# Patient Record
Sex: Male | Born: 1965 | Race: White | Hispanic: No | Marital: Single | Smoking: Current every day smoker
Health system: Southern US, Community
[De-identification: ages and names within clinical notes are randomized; demographics above are authoritative.]

## PROBLEM LIST (undated history)

## (undated) DIAGNOSIS — I7300 Raynaud's syndrome without gangrene: Secondary | ICD-10-CM

## (undated) DIAGNOSIS — Z0289 Encounter for other administrative examinations: Secondary | ICD-10-CM

## (undated) DIAGNOSIS — M1990 Unspecified osteoarthritis, unspecified site: Secondary | ICD-10-CM

## (undated) DIAGNOSIS — M329 Systemic lupus erythematosus, unspecified: Secondary | ICD-10-CM

## (undated) DIAGNOSIS — Z8739 Personal history of other diseases of the musculoskeletal system and connective tissue: Secondary | ICD-10-CM

## (undated) DIAGNOSIS — J449 Chronic obstructive pulmonary disease, unspecified: Secondary | ICD-10-CM

## (undated) DIAGNOSIS — M797 Fibromyalgia: Secondary | ICD-10-CM

## (undated) HISTORY — DX: Fibromyalgia: M79.7

## (undated) HISTORY — PX: HERNIA REPAIR: SHX51

## (undated) HISTORY — PX: ABDOMINAL SURGERY: SHX537

## (undated) HISTORY — DX: Raynaud's syndrome without gangrene: I73.00

## (undated) HISTORY — DX: Unspecified osteoarthritis, unspecified site: M19.90

## (undated) HISTORY — DX: Encounter for other administrative examinations: Z02.89

## (undated) HISTORY — DX: Chronic obstructive pulmonary disease, unspecified: J44.9

## (undated) HISTORY — DX: Systemic lupus erythematosus, unspecified: M32.9

---

## 2002-07-11 ENCOUNTER — Inpatient Hospital Stay (HOSPITAL_COMMUNITY): Admission: EM | Admit: 2002-07-11 | Discharge: 2002-07-14 | Payer: Self-pay | Admitting: Psychiatry

## 2006-01-15 ENCOUNTER — Emergency Department: Payer: Self-pay | Admitting: Emergency Medicine

## 2006-01-19 ENCOUNTER — Ambulatory Visit: Payer: Self-pay | Admitting: Psychiatry

## 2006-01-19 ENCOUNTER — Inpatient Hospital Stay (HOSPITAL_COMMUNITY): Admission: AD | Admit: 2006-01-19 | Discharge: 2006-01-23 | Payer: Self-pay | Admitting: Psychiatry

## 2006-04-25 ENCOUNTER — Emergency Department: Payer: Self-pay | Admitting: General Practice

## 2006-05-11 ENCOUNTER — Inpatient Hospital Stay: Payer: Self-pay | Admitting: Unknown Physician Specialty

## 2007-10-02 ENCOUNTER — Emergency Department: Payer: Self-pay | Admitting: Emergency Medicine

## 2007-10-02 ENCOUNTER — Other Ambulatory Visit: Payer: Self-pay

## 2010-08-06 ENCOUNTER — Inpatient Hospital Stay: Payer: Self-pay | Admitting: Psychiatry

## 2010-08-21 ENCOUNTER — Inpatient Hospital Stay: Payer: Self-pay | Admitting: Psychiatry

## 2010-12-09 ENCOUNTER — Emergency Department: Payer: Self-pay | Admitting: Emergency Medicine

## 2010-12-18 ENCOUNTER — Emergency Department: Payer: Self-pay | Admitting: Emergency Medicine

## 2014-07-23 ENCOUNTER — Emergency Department
Admit: 2014-07-23 | Disposition: A | Discharge status: Home / Self Care | Payer: Self-pay | Admitting: Emergency Medicine

## 2014-12-05 ENCOUNTER — Emergency Department
Admission: EM | Admit: 2014-12-05 | Discharge: 2014-12-05 | Disposition: A | Discharge status: Home / Self Care | Payer: Self-pay | Attending: Emergency Medicine | Admitting: Emergency Medicine

## 2014-12-05 ENCOUNTER — Encounter: Payer: Self-pay | Admitting: *Deleted

## 2014-12-05 DIAGNOSIS — Y9389 Activity, other specified: Secondary | ICD-10-CM | POA: Insufficient documentation

## 2014-12-05 DIAGNOSIS — X58XXXA Exposure to other specified factors, initial encounter: Secondary | ICD-10-CM | POA: Insufficient documentation

## 2014-12-05 DIAGNOSIS — T1502XA Foreign body in cornea, left eye, initial encounter: Secondary | ICD-10-CM | POA: Insufficient documentation

## 2014-12-05 DIAGNOSIS — Z720 Tobacco use: Secondary | ICD-10-CM | POA: Insufficient documentation

## 2014-12-05 DIAGNOSIS — Y998 Other external cause status: Secondary | ICD-10-CM | POA: Insufficient documentation

## 2014-12-05 DIAGNOSIS — T1592XA Foreign body on external eye, part unspecified, left eye, initial encounter: Secondary | ICD-10-CM

## 2014-12-05 DIAGNOSIS — Y9289 Other specified places as the place of occurrence of the external cause: Secondary | ICD-10-CM | POA: Insufficient documentation

## 2014-12-05 HISTORY — DX: Unspecified osteoarthritis, unspecified site: M19.90

## 2014-12-05 MED ORDER — CIPROFLOXACIN HCL 0.3 % OP SOLN
OPHTHALMIC | Status: AC
  Filled 2014-12-05: qty 2.5

## 2014-12-05 MED ORDER — CIPROFLOXACIN HCL 0.3 % OP SOLN
2.0000 [drp] | OPHTHALMIC | Status: DC
  Administered 2014-12-05: 2 [drp] via OPHTHALMIC

## 2014-12-05 MED ORDER — FLUORESCEIN SODIUM 1 MG OP STRP: 1.0000 | ORAL_STRIP | Freq: Once | OPHTHALMIC | Status: DC

## 2014-12-05 MED ORDER — CIPROFLOXACIN HCL 0.3 % OP SOLN: 1.0000 [drp] | OPHTHALMIC | Status: AC

## 2014-12-05 MED ORDER — TETRACAINE HCL 0.5 % OP SOLN
2.0000 [drp] | Freq: Once | OPHTHALMIC | Status: AC
  Administered 2014-12-05: 2 [drp] via OPHTHALMIC

## 2014-12-05 MED ORDER — KETOROLAC TROMETHAMINE 0.5 % OP SOLN: 1.0000 [drp] | Freq: Four times a day (QID) | OPHTHALMIC | Status: DC

## 2014-12-05 MED ORDER — TETRACAINE HCL 0.5 % OP SOLN
OPHTHALMIC | Status: AC
  Administered 2014-12-05: 2 [drp] via OPHTHALMIC
  Filled 2014-12-05: qty 2

## 2014-12-05 MED ORDER — FLUORESCEIN SODIUM 1 MG OP STRP
ORAL_STRIP | OPHTHALMIC | Status: AC
  Filled 2014-12-05: qty 1

## 2014-12-05 NOTE — ED Provider Notes (Signed)
[facility] Emergency Department Provider Note ____________________________________________  Time seen: Approximately 11:26 AM  I have reviewed the triage vital signs and the nursing notes.   HISTORY  Chief Complaint Foreign Body in Eye   HPI Jason Cantu is a 49 y.o. male who presents to the emergency department for left eye pain. He states that on Thursday he was helping drill holes in a brick wall that had metal behind it. He states he felt something hit his eye. He states he is resistant several times and thought that he had gotten it out, but overnight the pain and irritation has gotten worse.   Past Medical History  Diagnosis Date  . Arthritis     There are no active problems to display for this patient.   No past surgical history on file.  Current Outpatient Rx  Name  Route  Sig  Dispense  Refill  . ciprofloxacin (CILOXAN) 0.3 % ophthalmic solution   Right Eye   Place 1 drop into the right eye every 4 (four) hours while awake. Administer 1 drop, every 2 hours, while awake, for 2 days. Then 1 drop, every 4 hours, while awake, for the next 5 days.   5 mL   0   . ketorolac (ACULAR) 0.5 % ophthalmic solution   Left Eye   Place 1 drop into the left eye 4 (four) times daily.   5 mL   0     Allergies Codeine and Nortriptyline  No family history on file.  Social History Social History  Substance Use Topics  . Smoking status: Current Every Day Smoker -- 1.00 packs/day    Types: Cigarettes  . Smokeless tobacco: None  . Alcohol Use: Yes    Review of Systems   Constitutional: No fever/chills Eyes: Visual changes: yes--blurry. ENT: No sore throat. Cardiovascular: Denies chest pain. Respiratory: Denies shortness of breath. Gastrointestinal: No abdominal pain.  No nausea, no vomiting.  No diarrhea.  No constipation. Musculoskeletal: Negative for pain. Skin: Negative for rash. Neurological: Negative for headaches, focal weakness  or numbness. Psychiatric:At baseline, no complaint Lymphatic:Swollen nodes-- no Allergic: Seasonal allergies: no 10-point ROS otherwise negative.  ____________________________________________  PHYSICAL EXAM:  VITAL SIGNS: ED Triage Vitals  Enc Vitals Group     BP 12/05/14 1032 117/73 mmHg     Pulse Rate 12/05/14 1032 105     Resp --      Temp 12/05/14 1032 97.8 F (36.6 C)     Temp Source 12/05/14 1032 Oral     SpO2 12/05/14 1032 96 %     Weight 12/05/14 1032 152 lb (68.947 kg)     Height 12/05/14 1032 5\' 6"  (1.676 m)     Head Cir --      Peak Flow --      Pain Score 12/05/14 1033 8     Pain Loc --      Pain Edu? --      Excl. in [place]? --     Constitutional: Alert and oriented. Well appearing and in no acute distress. Eyes: Visual acuity--see nursing documentation; No globe trauma; Eyelids normal to inspection; Everted for exam yes; Conjunctiva and sclera: erythematous; Corneas: foreign body; Examined with fluorescein no; EOM's intact; Pupils PERRLA; Anterior Chambers normal with limited exam.  Head: Atraumatic. Nose: No congestion/rhinnorhea. Mouth/Throat: Mucous membranes are moist.  Oropharynx non-erythematous. Neck: No stridor.  Cardiovascular: Normal rate, regular rhythm. Grossly normal heart sounds.  Good peripheral circulation. Respiratory: Normal respiratory effort.  No  retractions. Gastrointestinal: Soft and nontender. No distention. No abdominal bruits. No CVA tenderness. Musculoskeletal:Normal ROM Neurologic:  Normal speech and language. No gross focal neurologic deficits are appreciated. Speech is normal. No gait instability. Skin:  Skin is warm, dry and intact. No rash noted. Psychiatric: Mood and affect are normal. Speech and behavior are normal.  ____________________________________________   LABS (all labs ordered are listed, but only abnormal results are displayed)  Labs Reviewed - No data to  display ____________________________________________  EKG  ____________________________________________  RADIOLOGY  ____________________________________________   PROCEDURES  Procedure(s) performed:  Foreign body removed from the 8 o'clock position with cotton swab. Black speck. No metal ring noted.   ____________________________________________   INITIAL IMPRESSION / ASSESSMENT AND PLAN / ED COURSE  Pertinent labs & imaging results that were available during my care of the patient were reviewed by me and considered in my medical decision making (see chart for details).  Patient was advised to follow up with ophthalmology for symptoms that are not improving over the next 2 days. He was  also advised to return to the ER for symptoms that change or worsen if unable to schedule an appointment.  ____________________________________________   FINAL CLINICAL IMPRESSION(S) / ED DIAGNOSES  Final diagnoses:  Eye foreign body, left, initial encounter       Victorino Dike, FNP 12/05/14 [place], MD 12/05/14 [phone]

## 2014-12-05 NOTE — ED Notes (Addendum)
Pt states that he was helping a friend drill holes in the wall on Thursday and has either metal or brick in his left eye. He reports that pain, drainage, and swelling worse today.  Pt reports pain 8/10. Eye is red and swollen.

## 2014-12-05 NOTE — ED Notes (Signed)
Pt given eye drops and rx

## 2014-12-05 NOTE — ED Notes (Signed)
Pt has foreign body in left eye, left eye is swollen and red

## 2014-12-05 NOTE — Discharge Instructions (Signed)
Eye, Foreign Body The term foreign body refers to any object near, on the surface of or in the eye that should not be there. A foreign body may be a small speck of dirt or dust, a hair or eyelash, a splinter or any object. CAUSES  Foreign bodies can get in the eye by:  Flying pieces of something that was broken or destroyed (debris).  A sudden injury (trauma) to the eye. SYMPTOMS  Symptoms depend on what the foreign body is and where it is in the eye. The most common locations are:  On the inner surface of the upper or lower eyelids or on the covering of the white part of the eye (conjunctiva). Symptoms in this location are:  Irritating and painful, especially when blinking.  Feeling like something is in the eye.  On the surface of the clear covering on the front of the eye (cornea). A corneal foreign body has symptoms that:  Are painful and irritating since the cornea is very sensitive.  Form small "rust rings" around a metallic foreign body. Metallic foreign bodies stick more firmly to the surface of the cornea.  Inside the eyeball. Infection can happen fast and can be hard to treat with antibiotics. This is an extremely dangerous situation. Foreign bodies inside the eye can threaten vision. A person may even loose their eye. Foreign bodies inside the eye may cause:  Great pain.  Immediate loss of vision. DIAGNOSIS  Foreign bodies are found during an exam by an eye specialist. Those that are on the eyelids, conjunctiva or cornea are usually (but not always) easily found. When a foreign body is inside the eyeball, a cataract may form almost right away. This makes it hard for an ophthalmologist to find the foreign body. Special tests may be needed, including ultrasound testing, X-rays and CT scans. TREATMENT   Foreign bodies that are on the eyelids, conjunctiva or cornea are often removed easily and painlessly.  If the foreign body has caused a scratch or abrasion of the cornea,  antibiotic drops, ointments and/or a tight patch called a "pressure patch" may be needed. Follow-up exams will be needed for several days until the abrasion heals.  Surgery is needed right away if the foreign body is inside the eyeball. This is a medical emergency. An antibiotic therapy will likely be given to stop an infection. HOME CARE INSTRUCTIONS  The use of eye patches is not universal. Their use varies from state to state and from caregiver to caregiver. If an eye patch was applied:  Keep the eye patch on for as long as directed by your caregiver until the follow-up appointment.  Do not remove the patch to put in medications unless instructed to do so. When replacing the patch, retape it as it was before. Follow the same procedure if the patch becomes loose.  WARNING: Do not drive or operate machinery while the eye is patched. The ability to judge distances will be impaired.  Only take over-the-counter or prescription medicines for pain, discomfort or fever as directed by the caregiver. If no eye patch was applied:  Keep the eye closed as much as possible. Do not rub the eye.  Wear dark glasses as needed to protect the eyes from bright light.  Do not wear contact lenses until the eye feels normal again, or as instructed.  Wear protective eye covering if there is a risk of eye injury. This is important when working with high speed tools.  Only take over-the-counter or   prescription medicines for pain, discomfort or fever as directed by the caregiver. SEEK IMMEDIATE MEDICAL CARE IF:   Pain increases in the eye or the vision changes.  You or your child has problems with the eye patch.  The injury to the eye appears to be getting larger.  There is discharge from the injured eye.  Swelling and/or soreness (inflammation) develops around the affected eye.  You or your child has an oral temperature above 102 F (38.9 C), not controlled by medicine.  Your baby is older than 3  months with a rectal temperature of 102 F (38.9 C) or higher.  Your baby is 3 months old or younger with a rectal temperature of 100.4 F (38 C) or higher. MAKE SURE YOU:   Understand these instructions.  Will watch your condition.  Will get help right away if you are not doing well or get worse. Document Released: 03/23/2005 Document Revised: 06/15/2011 Document Reviewed: 08/18/2012 [employer] Patient Information 2015 [employer]. This information is not intended to replace advice given to you by your health care provider. Make sure you discuss any questions you have with your health care provider.  

## 2015-08-22 DIAGNOSIS — I7300 Raynaud's syndrome without gangrene: Secondary | ICD-10-CM

## 2015-08-22 DIAGNOSIS — J449 Chronic obstructive pulmonary disease, unspecified: Secondary | ICD-10-CM

## 2015-08-22 DIAGNOSIS — M329 Systemic lupus erythematosus, unspecified: Secondary | ICD-10-CM

## 2015-08-22 DIAGNOSIS — M1990 Unspecified osteoarthritis, unspecified site: Secondary | ICD-10-CM

## 2015-08-22 DIAGNOSIS — M797 Fibromyalgia: Secondary | ICD-10-CM | POA: Insufficient documentation

## 2015-08-22 HISTORY — DX: Unspecified osteoarthritis, unspecified site: M19.90

## 2015-08-22 HISTORY — DX: Systemic lupus erythematosus, unspecified: M32.9

## 2015-08-22 HISTORY — DX: Chronic obstructive pulmonary disease, unspecified: J44.9

## 2015-08-22 HISTORY — DX: Raynaud's syndrome without gangrene: I73.00

## 2015-08-22 HISTORY — DX: Fibromyalgia: M79.7

## 2015-09-05 DIAGNOSIS — Z0289 Encounter for other administrative examinations: Secondary | ICD-10-CM

## 2015-09-05 HISTORY — DX: Encounter for other administrative examinations: Z02.89

## 2016-01-27 ENCOUNTER — Encounter: Payer: Self-pay | Admitting: Emergency Medicine

## 2016-01-27 ENCOUNTER — Emergency Department: Payer: Medicare HMO

## 2016-01-27 ENCOUNTER — Emergency Department
Admission: EM | Admit: 2016-01-27 | Discharge: 2016-01-27 | Disposition: A | Discharge status: Home / Self Care | Payer: Medicare HMO | Attending: Emergency Medicine | Admitting: Emergency Medicine

## 2016-01-27 DIAGNOSIS — Z79899 Other long term (current) drug therapy: Secondary | ICD-10-CM | POA: Diagnosis not present

## 2016-01-27 DIAGNOSIS — F17210 Nicotine dependence, cigarettes, uncomplicated: Secondary | ICD-10-CM | POA: Insufficient documentation

## 2016-01-27 DIAGNOSIS — K5732 Diverticulitis of large intestine without perforation or abscess without bleeding: Secondary | ICD-10-CM | POA: Insufficient documentation

## 2016-01-27 DIAGNOSIS — Z7982 Long term (current) use of aspirin: Secondary | ICD-10-CM | POA: Diagnosis not present

## 2016-01-27 DIAGNOSIS — R112 Nausea with vomiting, unspecified: Secondary | ICD-10-CM

## 2016-01-27 DIAGNOSIS — K2970 Gastritis, unspecified, without bleeding: Secondary | ICD-10-CM | POA: Diagnosis not present

## 2016-01-27 DIAGNOSIS — Z791 Long term (current) use of non-steroidal anti-inflammatories (NSAID): Secondary | ICD-10-CM | POA: Insufficient documentation

## 2016-01-27 DIAGNOSIS — R197 Diarrhea, unspecified: Secondary | ICD-10-CM

## 2016-01-27 HISTORY — DX: Systemic lupus erythematosus, unspecified: M32.9

## 2016-01-27 LAB — COMPREHENSIVE METABOLIC PANEL
ALBUMIN: 4.6 g/dL (ref 3.5–5.0)
ALT: 19 U/L (ref 17–63)
ANION GAP: 11 (ref 5–15)
AST: 26 U/L (ref 15–41)
Alkaline Phosphatase: 54 U/L (ref 38–126)
BILIRUBIN TOTAL: 1.2 mg/dL (ref 0.3–1.2)
BUN: 12 mg/dL (ref 6–20)
CHLORIDE: 99 mmol/L — AB (ref 101–111)
CO2: 23 mmol/L (ref 22–32)
Calcium: 10.4 mg/dL — ABNORMAL HIGH (ref 8.9–10.3)
Creatinine, Ser: 0.92 mg/dL (ref 0.61–1.24)
GFR calc Af Amer: >60 mL/min (ref >60)
GFR calc non Af Amer: >60 mL/min (ref >60)
GLUCOSE: 122 mg/dL — AB (ref 65–99)
POTASSIUM: 3.3 mmol/L — AB (ref 3.5–5.1)
SODIUM: 133 mmol/L — AB (ref 135–145)
TOTAL PROTEIN: 8.6 g/dL — AB (ref 6.5–8.1)

## 2016-01-27 LAB — URINALYSIS COMPLETE WITH MICROSCOPIC (ARMC ONLY)
BACTERIA UA: NONE SEEN
Bilirubin Urine: NEGATIVE
Glucose, UA: NEGATIVE mg/dL
Hgb urine dipstick: NEGATIVE
NITRITE: NEGATIVE
PH: 5 (ref 5.0–8.0)
PROTEIN: 100 mg/dL — AB
SPECIFIC GRAVITY, URINE: 1.021 (ref 1.005–1.030)
SQUAMOUS EPITHELIAL / LPF: NONE SEEN

## 2016-01-27 LAB — CBC
HEMATOCRIT: 50.2 % (ref 40.0–52.0)
HEMOGLOBIN: 17.4 g/dL (ref 13.0–18.0)
MCH: 34.6 pg — ABNORMAL HIGH (ref 26.0–34.0)
MCHC: 34.6 g/dL (ref 32.0–36.0)
MCV: 99.8 fL (ref 80.0–100.0)
Platelets: 272 10*3/uL (ref 150–440)
RBC: 5.03 MIL/uL (ref 4.40–5.90)
RDW: 13.1 % (ref 11.5–14.5)
WBC: 15.2 10*3/uL — ABNORMAL HIGH (ref 3.8–10.6)

## 2016-01-27 LAB — TROPONIN I

## 2016-01-27 LAB — LIPASE, BLOOD: LIPASE: 29 U/L (ref 11–51)

## 2016-01-27 MED ORDER — OXYCODONE-ACETAMINOPHEN 5-325 MG PO TABS: 1.0000 | ORAL_TABLET | Freq: Four times a day (QID) | ORAL | 0 refills | Status: DC | PRN

## 2016-01-27 MED ORDER — ONDANSETRON HCL 4 MG/2ML IJ SOLN
4.0000 mg | Freq: Once | INTRAMUSCULAR | Status: AC
  Administered 2016-01-27: 4 mg via INTRAVENOUS
  Filled 2016-01-27: qty 2

## 2016-01-27 MED ORDER — IOPAMIDOL (ISOVUE-300) INJECTION 61%
100.0000 mL | Freq: Once | INTRAVENOUS | Status: AC | PRN
  Administered 2016-01-27: 100 mL via INTRAVENOUS

## 2016-01-27 MED ORDER — FENTANYL CITRATE (PF) 100 MCG/2ML IJ SOLN
50.0000 ug | INTRAMUSCULAR | Status: AC | PRN
  Administered 2016-01-27 (×2): 50 ug via INTRAVENOUS
  Filled 2016-01-27 (×2): qty 2

## 2016-01-27 MED ORDER — GI COCKTAIL ~~LOC~~
30.0000 mL | Freq: Once | ORAL | Status: AC
  Administered 2016-01-27: 30 mL via ORAL
  Filled 2016-01-27: qty 30

## 2016-01-27 MED ORDER — CIPROFLOXACIN HCL 500 MG PO TABS: 500.0000 mg | ORAL_TABLET | Freq: Two times a day (BID) | ORAL | 0 refills | Status: AC

## 2016-01-27 MED ORDER — SODIUM CHLORIDE 0.9 % IV BOLUS (SEPSIS)
1000.0000 mL | Freq: Once | INTRAVENOUS | Status: AC
  Administered 2016-01-27: 1000 mL via INTRAVENOUS

## 2016-01-27 MED ORDER — METRONIDAZOLE 500 MG PO TABS: 500.0000 mg | ORAL_TABLET | Freq: Three times a day (TID) | ORAL | 0 refills | Status: AC

## 2016-01-27 MED ORDER — PANTOPRAZOLE SODIUM 20 MG PO TBEC: 20.0000 mg | DELAYED_RELEASE_TABLET | Freq: Every day | ORAL | 1 refills | Status: DC

## 2016-01-27 MED ORDER — ONDANSETRON 4 MG PO TBDP: 4.0000 mg | ORAL_TABLET | Freq: Three times a day (TID) | ORAL | 0 refills | Status: DC | PRN

## 2016-01-27 MED ORDER — IOPAMIDOL (ISOVUE-300) INJECTION 61%
30.0000 mL | Freq: Once | INTRAVENOUS | Status: AC | PRN
  Administered 2016-01-27: 30 mL via ORAL

## 2016-01-27 NOTE — ED Triage Notes (Signed)
Upper abd pain graduall onset over last few days.  Also chest pain.   Diarrhea and vomiting.

## 2016-01-27 NOTE — ED Notes (Signed)
Report from Janie, RN. Care assumed by this RN. 

## 2016-01-27 NOTE — ED Provider Notes (Signed)
[facility] Emergency Department Provider Note  Time seen: 9:16 AM  I have reviewed the triage vital signs and the nursing notes.   HISTORY  Chief Complaint Abdominal Pain and Emesis    HPI Jason Cantu is a 50 y.o. male with a past medical history of lupus, who presents to the emergency department with upper abdominal pain, nausea, vomiting, diarrhea. According to the patient for the past 2 weeks he has been experiencing intermittent upper abdominal pain which she describes as a burning aching sensation. States the pain has been constant over the past 4 days. States nausea, vomiting, occasional episodes of diarrhea. States he will often have to wake up first thing in the morning to vomit and then feels somewhat better. Patient states he had similar issues 10-15 years ago when he had an endoscopy performed. He was placed on Nexium and had resolution of his discomfort. He has been taking Prilosec daily without any relief this time. Denies any fever. Describes his pain as moderate currently. States he has not been eating or drinking much over the past 3 or 4 days. Patient does state he normally drinks 3 or 4 beers per day.  Past Medical History:  Diagnosis Date  . Arthritis   . Lupus (systemic lupus erythematosus) (HCC)     There are no active problems to display for this patient.   Past Surgical History:  Procedure Laterality Date  . ABDOMINAL SURGERY    . HERNIA REPAIR      Prior to Admission medications   Medication Sig Start Date End Date Taking? Authorizing Provider  ketorolac (ACULAR) 0.5 % ophthalmic solution Place 1 drop into the left eye 4 (four) times daily. 12/05/14   Victorino Dike, FNP    Allergies  Allergen Reactions  . Codeine Nausea Only  . Nortriptyline Other (See Comments)    Other reaction(s): Other (See Comments) Caused body to jerk all over per pt.  . Hydrocodone Nausea And Vomiting    No family history on file.  Social  History Social History  Substance Use Topics  . Smoking status: Current Every Day Smoker    Packs/day: 1.00    Types: Cigarettes  . Smokeless tobacco: Never Used  . Alcohol use Yes    Review of Systems Constitutional: Negative for fever. Cardiovascular: Negative for chest pain. Respiratory: Negative for shortness of breath. Gastrointestinal: Upper abdominal pain. Positive nausea, vomiting, diarrhea. Genitourinary: Negative for dysuria. Neurological: Negative for headache 10-point ROS otherwise negative.  ____________________________________________   PHYSICAL EXAM:  VITAL SIGNS: ED Triage Vitals [01/27/16 0732]  Enc Vitals Group     BP (!) 155/90     Pulse Rate 81     Resp 18     Temp 98.2 F (36.8 C)     Temp Source Oral     SpO2 98 %     Weight      Height      Head Circumference      Peak Flow      Pain Score 10     Pain Loc      Pain Edu?      Excl. in [place]?     Constitutional: Alert and oriented. Well appearing and in no distress. Eyes: Normal exam ENT   Head: Normocephalic and atraumatic.   Mouth/Throat: Mucous membranes are moist. Cardiovascular: Normal rate, regular rhythm. No murmur Respiratory: Normal respiratory effort without tachypnea nor retractions. Breath sounds are clear  Gastrointestinal: Soft, diffuse moderate tenderness palpation  without rebound or guarding, appears to have more pain in the upper abdomen. No distention. Musculoskeletal: Nontender with normal range of motion in all extremities.  Neurologic:  Normal speech and language. No gross focal neurologic deficits Skin:  Skin is warm, dry and intact.  Psychiatric: Mood and affect are normal. Speech and behavior are normal.   ____________________________________________    EKG  EKG reviewed and interpreted by myself shows sinus tachycardia 103 bpm, narrow QRS, left axis deviation, normal intervals with nonspecific ST changes. No ST  elevation.  ____________________________________________    RADIOLOGY  CT shows possible diverticulitis acute first chronic.  ____________________________________________   INITIAL IMPRESSION / ASSESSMENT AND PLAN / ED COURSE  Pertinent labs & imaging results that were available during my care of the patient were reviewed by me and considered in my medical decision making (see chart for details).  Patient presents for diffuse abdominal pain worse over the past 4 days. States the pain is more upper body hurts all over his abdomen. Patient's symptoms are very suggestive of gastritis/ulcerative disease. However given the patient's diffuse moderate tenderness to palpation on physical exam along with leukocytosis, will proceed with a CT abdomen/pelvis to further evaluate. We will treat with a GI cocktail, IV fluids and treat the patient's nausea.  CT shows possible diverticulitis acute first chronic. Given the patient's increased abdominal pain with diarrhea we'll cover with antibiotics. We'll also place the patient on Protonix and Zofran. Patient will need to follow up with GI medicine. I discussed this plan of care with the patient, he is agreeable. ____________________________________________   FINAL CLINICAL IMPRESSION(S) / ED DIAGNOSES  Gastritis Epigastric pain Diverticulitis   Harvest Dark, MD 01/27/16 1142

## 2016-01-27 NOTE — Discharge Instructions (Signed)
As we discussed please take your antibiotics as prescribed. Nausea medication as needed. Please follow-up with GI medicine by calling the number provided to arrange a follow-up appointment as soon as possible. Return to the emergency department for any worsening symptoms.

## 2016-01-27 NOTE — ED Notes (Signed)
MD at bedside. 

## 2016-01-27 NOTE — ED Notes (Signed)
Pt alert and oriented X4, active, cooperative, pt in NAD. RR even and unlabored, color WNL.  Pt informed to return if any life threatening symptoms occur.   

## 2016-01-28 ENCOUNTER — Other Ambulatory Visit: Payer: Self-pay

## 2016-01-29 ENCOUNTER — Ambulatory Visit (INDEPENDENT_AMBULATORY_CARE_PROVIDER_SITE_OTHER): Payer: Self-pay | Admitting: Gastroenterology

## 2016-01-29 ENCOUNTER — Other Ambulatory Visit: Payer: Self-pay

## 2016-01-29 ENCOUNTER — Encounter: Payer: Self-pay | Admitting: Gastroenterology

## 2016-01-29 VITALS — BP 102/71 | HR 85 | Temp 98.2°F | Ht 66.0 in | Wt 141.0 lb

## 2016-01-29 DIAGNOSIS — K5792 Diverticulitis of intestine, part unspecified, without perforation or abscess without bleeding: Secondary | ICD-10-CM

## 2016-01-29 DIAGNOSIS — R112 Nausea with vomiting, unspecified: Secondary | ICD-10-CM | POA: Insufficient documentation

## 2016-01-29 DIAGNOSIS — R197 Diarrhea, unspecified: Secondary | ICD-10-CM | POA: Insufficient documentation

## 2016-01-29 DIAGNOSIS — R1084 Generalized abdominal pain: Secondary | ICD-10-CM

## 2016-01-29 DIAGNOSIS — F1220 Cannabis dependence, uncomplicated: Secondary | ICD-10-CM

## 2016-01-29 DIAGNOSIS — Z791 Long term (current) use of non-steroidal anti-inflammatories (NSAID): Secondary | ICD-10-CM | POA: Insufficient documentation

## 2016-01-29 MED ORDER — PEG 3350-KCL-NABCB-NACL-NASULF 236 G PO SOLR: ORAL | 0 refills | Status: DC

## 2016-01-29 NOTE — Progress Notes (Signed)
Gastroenterology Consultation  Referring Provider:     No ref. provider found Primary Care Physician:  Pcp Not In System Primary Gastroenterologist:  Dr. [name]  Reason for Consultation:     Acute diverticulitis        HPI:   Jason Cantu is a 50 y.o. y/o male referred for consultation & management  by [facility] ER for acute diverticulitis He was recently seen at the ER at [facility] on 01/27/16 for upper abdominal pain , nausea, vomiting and diarrhea for 2 weeks.He underwent a CT scan of the abdomen which demonstrated possible either acute or chronic diverticulitis- tumor cannot be excluded . He did have an elevated WCC of 15 , Hb 17 grams. He was commenced on Zofran, oral antibiotics(ciprofloxacin and metronidazole till 02/10/16 ) and PPI , discharged with plan to follow up with GI.   Abdominal pain: Onset began earlier in the month on and off and the last 7 days all day long  Site all over the abdomen Radiation localized  Severity : this morning was 10/10 , 5-6/10 presently , comes and goes  Petra Kuba of pain: twisting in nature  Aggravating factors: eating food  Relieving factors : warm bath  NSAID use : celebrex: 3-4 months for arthritis  PPI use : On Protonix: daily , first thing in the morning  Gall bladder surgery : intact  Frequency of bowel movements: 3 bowel movents yesterday and today , watery  Relief with bowel movements : yes but not always  Gas/Bloating/Abdominal distension : yes  No blood in the stool .  Diarrhea not worse after starting antibiotics Chills + Sweating ++  Smoking ++ Marijuana use ++ : using all his life  Lots of nausea and vomiting : when he lays in the hot tub feels better.      Past Medical History:  Diagnosis Date  . Arthritis   . COPD (chronic obstructive pulmonary disease) ([place]) 08/22/2015  . DJD (degenerative joint disease) 08/22/2015  . Fibromyalgia 08/22/2015  . Lupus (systemic lupus erythematosus) ([place])   . Osteoarthritis 08/22/2015  .  Pain medication agreement signed 09/05/2015   Overview:  Patient signed pain contract with [facility] on September 05, 2015 and is only to receive opiate medications from [facility].  . Raynaud's phenomenon 08/22/2015  . SLE (systemic lupus erythematosus) ([place]) 08/22/2015    Past Surgical History:  Procedure Laterality Date  . ABDOMINAL SURGERY    . HERNIA REPAIR      Prior to Admission medications   Medication Sig Start Date End Date Taking? Authorizing Provider  acetaminophen (TYLENOL) 500 MG tablet Take 1,000 mg by mouth.   Yes Historical Provider, MD  CELEBREX 100 MG capsule Take 1 capsule by mouth daily. 01/27/16  Yes Historical Provider, MD  ciprofloxacin (CIPRO) 500 MG tablet Take 1 tablet (500 mg total) by mouth 2 (two) times daily. 01/27/16 02/10/16 Yes Harvest Dark, MD  hydroxychloroquine (PLAQUENIL) 200 MG tablet Take 200 mg by mouth 2 (two) times daily.  01/27/16  Yes Historical Provider, MD  levETIRAcetam (KEPPRA) 250 MG tablet Take 250 mg by mouth 3 (three) times daily.  01/27/16  Yes Historical Provider, MD  metroNIDAZOLE (FLAGYL) 500 MG tablet Take 1 tablet (500 mg total) by mouth 3 (three) times daily. 01/27/16 02/10/16 Yes Harvest Dark, MD  ondansetron (ZOFRAN ODT) 4 MG disintegrating tablet Take 1 tablet (4 mg total) by mouth every 8 (eight) hours as needed for nausea or vomiting. 01/27/16  Yes Harvest Dark, MD  oxyCODONE-acetaminophen (ROXICET) 5-325 MG tablet Take 1 tablet by mouth every 6 (six) hours as needed. 01/27/16  Yes Harvest Dark, MD  pantoprazole (PROTONIX) 20 MG tablet Take 1 tablet (20 mg total) by mouth daily. 01/27/16 01/26/17 Yes Harvest Dark, MD  PROAIR HFA 108 [phone] Base) MCG/ACT inhaler Inhale 1-2 puffs into the lungs every 6 (six) hours as needed.  01/08/16  Yes Historical Provider, MD  sertraline (ZOLOFT) 100 MG tablet Take 1.5 tablets by mouth every evening. 01/08/16  Yes Historical Provider, MD  sertraline (ZOLOFT) 50 MG  tablet  10/22/15  Yes Historical Provider, MD  traZODone (DESYREL) 50 MG tablet Take 100 mg by mouth. 01/14/16  Yes Historical Provider, MD  aspirin EC 81 MG tablet Take 81 mg by mouth daily. 01/05/06   Historical Provider, MD  ibuprofen (ADVIL,MOTRIN) 400 MG tablet Take 400 mg by mouth every 8 (eight) hours as needed.    Historical Provider, MD  omeprazole (PRILOSEC) 20 MG capsule Take 20 mg by mouth daily. 08/22/15   Historical Provider, MD  promethazine (PHENERGAN) 12.5 MG tablet Take 12.5 mg by mouth every 6 (six) hours as needed.  01/27/16   Historical Provider, MD    Family History  Problem Relation Age of Onset  . Heart disease Mother   . Hyperlipidemia Mother   . Hypertension Mother   . Heart disease Father   . Hyperlipidemia Father   . Hypertension Father   . Heart disease Brother   . Hyperlipidemia Brother   . Diabetes Brother      Social History  Substance Use Topics  . Smoking status: Current Every Day Smoker    Packs/day: 1.00    Types: Cigarettes  . Smokeless tobacco: Never Used  . Alcohol use Yes     Comment: occasional consumption    Allergies as of 01/29/2016 - Review Complete 01/29/2016  Allergen Reaction Noted  . Codeine Nausea Only 12/05/2014  . Nortriptyline Other (See Comments) 08/15/2012  . Hydrocodone Nausea And Vomiting 08/15/2012    Review of Systems:    All systems reviewed and negative except where noted in HPI.   Physical Exam:  BP 102/71   Pulse 85   Temp 98.2 F (36.8 C) (Oral)   Ht 5\' 6"  (1.676 m)   Wt 141 lb (64 kg)   BMI 22.76 kg/m  No LMP for male patient. Psych:  Alert and cooperative. Normal mood and affect. General:   Alert,  Well-developed, well-nourished, pleasant and cooperative in NAD Head:  Normocephalic and atraumatic. Eyes:  Sclera clear, no icterus.   Conjunctiva pink. Ears:  Normal auditory acuity. Nose:  No deformity, discharge, or lesions. Mouth:  No deformity or lesions,oropharynx pink & moist. Neck:  Supple; no  masses or thyromegaly. Lungs:  Respirations even and unlabored.  Clear throughout to auscultation.   No wheezes, crackles, or rhonchi. No acute distress. Heart:  Regular rate and rhythm; no murmurs, clicks, rubs, or gallops. Abdomen:  Normal bowel sounds.  No bruits.  Soft, non-tender and non-distended without masses, hepatosplenomegaly or hernias noted.  No guarding or rebound tenderness.  Scars seen over lower abdomen from prior hernia surgery repairs Msk:  Symmetrical without gross deformities.  Good, equal movement & strength bilaterally. Pulses:  Normal pulses noted. Extremities:  No clubbing or edema.  No cyanosis. Neurologic:  Alert and oriented x3;  grossly normal neurologically. Skin:  Intact without significant lesions or rashes.  No jaundice. Lymph Nodes:  No significant cervical adenopathy.  Psych:  Alert and cooperative. Normal mood and affect.  Imaging Studies: Ct Abdomen Pelvis W Contrast  Result Date: 01/27/2016 CLINICAL DATA:  Diffuse abdominal pain. Nausea vomiting diarrhea. History lupus. EXAM: CT ABDOMEN AND PELVIS WITH CONTRAST TECHNIQUE: Multidetector CT imaging of the abdomen and pelvis was performed using the standard protocol following bolus administration of intravenous contrast. CONTRAST:  1108mL ISOVUE-300 IOPAMIDOL (ISOVUE-300) INJECTION 61%, 65mL ISOVUE-300 IOPAMIDOL (ISOVUE-300) INJECTION 61% COMPARISON:  None. FINDINGS: Lower chest: Lung bases clear without infiltrate or effusion Hepatobiliary: Fatty infiltration liver which shows diffuse low attenuation. No focal liver lesion. Gallbladder and bile ducts normal. Pancreas: Negative Spleen: Negative Adrenals/Urinary Tract: Negative for renal mass or obstruction. Nonobstructing renal calculi bilaterally. Largest stone in the left lower pole measuring 8 x 15 mm. Urinary bladder normal. Stomach/Bowel: Stomach and duodenum normal. Negative for bowel obstruction. Normal appendix Sigmoid diverticula. There is a segment of  thickening of the sigmoid colon. No significant stranding in the adjacent fat. This may represent acute or chronic diverticulitis. Colon tumor not excluded. No abscess or fluid identified. Vascular/Lymphatic: Mild atherosclerotic disease. No aortic aneurysm. No lymphadenopathy. Reproductive: Moderate prostate enlargement with prostate calcifications. Other: Right inguinal hernia containing fat.  No free fluid. Musculoskeletal: Multilevel disc degeneration with its associated spurring. No acute skeletal abnormality. IMPRESSION: Sigmoid diverticulosis. There is associated thickening of the sigmoid colon which may be due to acute or chronic diverticulitis. No significant stranding in the surrounding fat suggesting this may be chronic . Underlying tumor cannot be excluded. Normal appendix Right inguinal hernia. Fatty liver Nonobstructive renal calculi bilaterally. Electronically Signed   By: Franchot Gallo M.D.   On: 01/27/2016 11:28    Assessment and Plan:   LONEY SMELLEY is a 50 y.o. y/o male has been referred for acute diverticulitis. 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  He may have gastritis from his NSAID use as well as a colitis . Presently he has diarrhea which is on and off.   My plan   1. Abdominal pain : Stop NSAID use , continue PPI. Check H pylori stool antigen and will perfom EGD 2. Acute diverticulitis : Continue antibiotics , clinically his abdomen is soft and non tender, We will call him in 48 hours to check how he is doing off all celebrex, if worse will need a CT scan to rule out an abscess . He will also call us sooner if not feeling any better,advised to maintain a temperature chart.  He will need a colonoscopy to rule out any underlying mass in 6-8 weeks.  3. Cannabis hyperemesis syndrome:  suggested to stop all cannabis use. Patient information provided.  4. Long term use of celebrex: advised to stop  5. Acute diarrhea: rule out infection with stool tests   I have discussed alternative options, risks & benefits,  which include, but are not limited to, bleeding, infection, perforation,respiratory complication & drug reaction.  The patient agrees with this plan & written consent will be obtained.    Dr [name] MD

## 2016-01-29 NOTE — Patient Instructions (Addendum)
Please provide patient information on Cannaboid hyperemesis syndrome   [email or web address]   1. Stop Celebrex 2. Stop marijuana use 3. Check temperature and maintain chart 4. If no better in 24-48 hours to call office back

## 2016-02-03 ENCOUNTER — Telehealth: Payer: Self-pay | Admitting: Gastroenterology

## 2016-02-03 NOTE — Telephone Encounter (Signed)
Patient would like to know if his lab results are back yet?

## 2016-02-04 NOTE — Telephone Encounter (Signed)
Please advise regarding lab results.

## 2016-02-04 NOTE — Telephone Encounter (Signed)
Left vm letting pt know so far the labs that have returned are negative but we are still waiting on a couple more to return. I will call him as soon as they are available.

## 2016-02-04 NOTE — Telephone Encounter (Signed)
Labs so far negative for any stool infection- few are still awaited.

## 2016-02-06 LAB — CLOSTRIDIUM DIFFICILE EIA: C difficile Toxins A+B, EIA: NEGATIVE

## 2016-02-06 LAB — FECAL LEUKOCYTES

## 2016-02-06 LAB — H. PYLORI ANTIGEN, STOOL: H PYLORI AG STL: NEGATIVE

## 2016-02-06 LAB — ROTAVIRUS ANTIGEN, STOOL: Rotavirus: NEGATIVE

## 2016-03-13 ENCOUNTER — Ambulatory Visit
Admission: RE | Admit: 2016-03-13 | Discharge: 2016-03-13 | Disposition: A | Discharge status: Home / Self Care | Payer: Medicare HMO | Source: Ambulatory Visit | Attending: Gastroenterology | Admitting: Gastroenterology

## 2016-03-13 ENCOUNTER — Ambulatory Visit: Payer: Medicare HMO | Admitting: Anesthesiology

## 2016-03-13 ENCOUNTER — Encounter: Payer: Self-pay | Admitting: *Deleted

## 2016-03-13 ENCOUNTER — Encounter
Admission: RE | Disposition: A | Discharge status: Home / Self Care | Payer: Self-pay | Source: Ambulatory Visit | Attending: Gastroenterology

## 2016-03-13 DIAGNOSIS — Z79899 Other long term (current) drug therapy: Secondary | ICD-10-CM | POA: Diagnosis not present

## 2016-03-13 DIAGNOSIS — R198 Other specified symptoms and signs involving the digestive system and abdomen: Secondary | ICD-10-CM

## 2016-03-13 DIAGNOSIS — K635 Polyp of colon: Secondary | ICD-10-CM | POA: Insufficient documentation

## 2016-03-13 DIAGNOSIS — R1013 Epigastric pain: Secondary | ICD-10-CM | POA: Diagnosis not present

## 2016-03-13 DIAGNOSIS — F17210 Nicotine dependence, cigarettes, uncomplicated: Secondary | ICD-10-CM | POA: Diagnosis not present

## 2016-03-13 DIAGNOSIS — K2970 Gastritis, unspecified, without bleeding: Secondary | ICD-10-CM

## 2016-03-13 DIAGNOSIS — K640 First degree hemorrhoids: Secondary | ICD-10-CM | POA: Diagnosis not present

## 2016-03-13 DIAGNOSIS — D120 Benign neoplasm of cecum: Secondary | ICD-10-CM | POA: Diagnosis not present

## 2016-03-13 DIAGNOSIS — K222 Esophageal obstruction: Secondary | ICD-10-CM | POA: Diagnosis not present

## 2016-03-13 DIAGNOSIS — R933 Abnormal findings on diagnostic imaging of other parts of digestive tract: Secondary | ICD-10-CM | POA: Diagnosis not present

## 2016-03-13 DIAGNOSIS — M329 Systemic lupus erythematosus, unspecified: Secondary | ICD-10-CM | POA: Diagnosis not present

## 2016-03-13 DIAGNOSIS — K2950 Unspecified chronic gastritis without bleeding: Secondary | ICD-10-CM | POA: Diagnosis not present

## 2016-03-13 DIAGNOSIS — K5730 Diverticulosis of large intestine without perforation or abscess without bleeding: Secondary | ICD-10-CM | POA: Diagnosis not present

## 2016-03-13 DIAGNOSIS — K2900 Acute gastritis without bleeding: Secondary | ICD-10-CM

## 2016-03-13 DIAGNOSIS — M797 Fibromyalgia: Secondary | ICD-10-CM | POA: Insufficient documentation

## 2016-03-13 DIAGNOSIS — D122 Benign neoplasm of ascending colon: Secondary | ICD-10-CM

## 2016-03-13 DIAGNOSIS — J449 Chronic obstructive pulmonary disease, unspecified: Secondary | ICD-10-CM | POA: Diagnosis not present

## 2016-03-13 DIAGNOSIS — K449 Diaphragmatic hernia without obstruction or gangrene: Secondary | ICD-10-CM | POA: Diagnosis not present

## 2016-03-13 DIAGNOSIS — Z7982 Long term (current) use of aspirin: Secondary | ICD-10-CM | POA: Insufficient documentation

## 2016-03-13 HISTORY — DX: Personal history of other diseases of the musculoskeletal system and connective tissue: Z87.39

## 2016-03-13 HISTORY — PX: ESOPHAGOGASTRODUODENOSCOPY (EGD) WITH PROPOFOL: SHX5813

## 2016-03-13 HISTORY — PX: COLONOSCOPY WITH PROPOFOL: SHX5780

## 2016-03-13 SURGERY — COLONOSCOPY WITH PROPOFOL
Anesthesia: General

## 2016-03-13 MED ORDER — PHENYLEPHRINE HCL 10 MG/ML IJ SOLN
INTRAMUSCULAR | Status: DC | PRN
  Administered 2016-03-13: 100 ug via INTRAVENOUS

## 2016-03-13 MED ORDER — PROPOFOL 500 MG/50ML IV EMUL
INTRAVENOUS | Status: DC | PRN
  Administered 2016-03-13: 200 ug/kg/min via INTRAVENOUS

## 2016-03-13 MED ORDER — SODIUM CHLORIDE 0.9 % IV SOLN
INTRAVENOUS | Status: DC
  Administered 2016-03-13: 08:00:00 via INTRAVENOUS

## 2016-03-13 MED ORDER — MIDAZOLAM HCL 2 MG/2ML IJ SOLN
INTRAMUSCULAR | Status: DC | PRN
  Administered 2016-03-13: 1 mg via INTRAVENOUS

## 2016-03-13 MED ORDER — FENTANYL CITRATE (PF) 100 MCG/2ML IJ SOLN
INTRAMUSCULAR | Status: DC | PRN
  Administered 2016-03-13: 50 ug via INTRAVENOUS

## 2016-03-13 MED ORDER — PROPOFOL 10 MG/ML IV BOLUS
INTRAVENOUS | Status: DC | PRN
  Administered 2016-03-13: 40 mg via INTRAVENOUS
  Administered 2016-03-13: 130 mg via INTRAVENOUS
  Administered 2016-03-13: 70 mg via INTRAVENOUS

## 2016-03-13 NOTE — Anesthesia Postprocedure Evaluation (Signed)
Anesthesia Post Note  Patient: Wolfe K Massey  Procedure(s) Performed: Procedure(s) (LRB): COLONOSCOPY WITH PROPOFOL (N/A) ESOPHAGOGASTRODUODENOSCOPY (EGD) WITH PROPOFOL (N/A)  Patient location during evaluation: Endoscopy Anesthesia Type: General Level of consciousness: awake and alert and oriented Pain management: pain level controlled Vital Signs Assessment: post-procedure vital signs reviewed and stable Respiratory status: spontaneous breathing, nonlabored ventilation and respiratory function stable Cardiovascular status: blood pressure returned to baseline and stable Postop Assessment: no signs of nausea or vomiting Anesthetic complications: no    Last Vitals:  Vitals:   03/13/16 0723 03/13/16 0812  BP: 108/70 90/62  Pulse: 92 73  Resp: 18 18  Temp: 36.2 C 36.3 C    Last Pain:  Vitals:   03/13/16 0812  TempSrc: Tympanic  PainSc:                  Countess Biebel

## 2016-03-13 NOTE — Op Note (Signed)
[facility] Gastroenterology Patient Name: Jason Cantu Procedure Date: 03/13/2016 7:34 AM MRN: [id number] Account #: [id number] Date of Birth: Mar 07, 1966 Admit Type: Outpatient Age: 50 Room: [facility] ENDO ROOM 1 Gender: Male Note Status: Finalized Procedure:            Colonoscopy Indications:          Evaluation on imaging study of clinically significant                        abnormality Providers:            [name] MD, MD Medicines:            Monitored Anesthesia Care Complications:        No immediate complications. Procedure:            Pre-Anesthesia Assessment:                       - Prior to the procedure, a History and Physical was                        performed, and patient medications, allergies and                        sensitivities were reviewed. The patient's tolerance of                        previous anesthesia was reviewed.                       - The risks and benefits of the procedure and the                        sedation options and risks were discussed with the                        patient. All questions were answered and informed                        consent was obtained.                       - The risks and benefits of the procedure and the                        sedation options and risks were discussed with the                        patient. All questions were answered and informed                        consent was obtained.                       - ASA Grade Assessment: II - A patient with mild                        systemic disease.                       After obtaining informed consent, the colonoscope was  passed under direct vision. Throughout the procedure,                        the patient's blood pressure, pulse, and oxygen                        saturations were monitored continuously. The                        Colonoscope was introduced through the anus and   advanced to the the cecum, identified by the                        appendiceal orifice, IC valve and transillumination.                        The colonoscopy was performed with ease. The patient                        tolerated the procedure well. The quality of the bowel                        preparation was inadequate. Findings:      Multiple medium-mouthed diverticula were found in the sigmoid colon.      Non-bleeding internal hemorrhoids were found during retroflexion. The       hemorrhoids were medium-sized and Grade I (internal hemorrhoids that do       not prolapse).      Four sessile polyps were found in the ascending colon and cecum. The       polyps were 6 to 9 mm in size. Polypectomy was not attempted due to       inadequate bowel preparation. Impression:           - Preparation of the colon was inadequate.                       - Diverticulosis in the sigmoid colon.                       - Non-bleeding internal hemorrhoids.                       - Four 6 to 9 mm polyps in the ascending colon and in                        the cecum. Resection not attempted.                       - No specimens collected. Recommendation:       - Repeat colonoscopy in 4 weeks because the bowel                        preparation was poor.                       - Patient has a contact number available for                        emergencies. The signs and symptoms of potential  delayed complications were discussed with the patient.                        Return to normal activities tomorrow. Written discharge                        instructions were provided to the patient.                       - Resume previous diet.                       - Discharge patient to home (with escort).                       - Return to my office as previously scheduled. Procedure Code(s):    --- Professional ---                       [phone], Colonoscopy, flexible; diagnostic, including                         collection of specimen(s) by brushing or washing, when                        performed (separate procedure) Diagnosis Code(s):    --- Professional ---                       K64.0, First degree hemorrhoids                       D12.2, Benign neoplasm of ascending colon                       D12.0, Benign neoplasm of cecum                       K57.30, Diverticulosis of large intestine without                        perforation or abscess without bleeding                       R93.3, Abnormal findings on diagnostic imaging of other                        parts of digestive tract CPT copyright 2016 American Medical Association. All rights reserved. The codes documented in this report are preliminary and upon coder review may  be revised to meet current compliance requirements. [name], MD [name] MD, MD 03/13/2016 8:09:39 AM This report has been signed electronically. Number of Addenda: 0 Note Initiated On: 03/13/2016 7:34 AM Scope Withdrawal Time: 0 hours 4 minutes 53 seconds  Total Procedure Duration: 0 hours 8 minutes 58 seconds       [facility]

## 2016-03-13 NOTE — Op Note (Signed)
[facility] Gastroenterology Patient Name: Jason Cantu Procedure Date: 03/13/2016 7:35 AM MRN: [id number] Account #: [id number] Date of Birth: [birth year] Admit Type: Outpatient Age: 50 Room: [facility] ENDO ROOM 1 Gender: Male Note Status: Finalized Procedure:            Upper GI endoscopy Indications:          Epigastric abdominal pain Providers:            [name] MD, MD Medicines:            Monitored Anesthesia Care Complications:        No immediate complications. Procedure:            Pre-Anesthesia Assessment:                       - Prior to the procedure, a History and Physical was                        performed, and patient medications, allergies and                        sensitivities were reviewed. The patient's tolerance of                        previous anesthesia was reviewed.                       - The risks and benefits of the procedure and the                        sedation options and risks were discussed with the                        patient. All questions were answered and informed                        consent was obtained.                       - The risks and benefits of the procedure and the                        sedation options and risks were discussed with the                        patient. All questions were answered and informed                        consent was obtained.                       - ASA Grade Assessment: II - A patient with mild                        systemic disease.                       After obtaining informed consent, the endoscope was                        passed under direct vision. Throughout the procedure,  the patient's blood pressure, pulse, and oxygen                        saturations were monitored continuously. The Endoscope                        was introduced through the mouth, and advanced to the                        third part of duodenum. The upper GI endoscopy  was                        accomplished with ease. The patient tolerated the                        procedure well. Findings:      The examined duodenum was normal.      Diffuse moderate inflammation characterized by congestion (edema),       erosions and erythema was found in the gastric antrum. Biopsies were       taken with a cold forceps for histology.      A mild Schatzki ring (acquired) was found in the lower third of the       esophagus.      A 2 cm hiatal hernia was present. Impression:           - Normal examined duodenum.                       - Gastritis. Biopsied.                       - Mild Schatzki ring.                       - 2 cm hiatal hernia. Recommendation:       - Patient has a contact number available for                        emergencies. The signs and symptoms of potential                        delayed complications were discussed with the patient.                        Return to normal activities tomorrow. Written discharge                        instructions were provided to the patient.                       - Discharge patient to home (with escort).                       - Resume previous diet.                       - No ibuprofen, naproxen, or other non-steroidal                        anti-inflammatory drugs.                       -  Return to my office as previously scheduled.                       - Await pathology results. Procedure Code(s):    --- Professional ---                       [phone], Esophagogastroduodenoscopy, flexible, transoral;                        with biopsy, single or multiple Diagnosis Code(s):    --- Professional ---                       K29.70, Gastritis, unspecified, without bleeding                       K22.2, Esophageal obstruction                       K44.9, Diaphragmatic hernia without obstruction or                        gangrene                       R10.13, Epigastric pain CPT copyright 2016 American Medical  Association. All rights reserved. The codes documented in this report are preliminary and upon coder review may  be revised to meet current compliance requirements. [name], MD [name] MD, MD 03/13/2016 7:55:16 AM This report has been signed electronically. Number of Addenda: 0 Note Initiated On: 03/13/2016 7:35 AM      [facility]

## 2016-03-13 NOTE — H&P (Signed)
[name] MD [address] [place], [place] [address] Phone: [phone] Fax : [phone]  Primary Care Physician:  Pcp Not In System Primary Gastroenterologist:  Dr. [name]   Pre-Procedure History & Physical: HPI:  Jason Cantu is a 50 y.o. male is here for an endoscopy and colonoscopy.   Past Medical History:  Diagnosis Date  . Arthritis   . COPD (chronic obstructive pulmonary disease) ([place]) 08/22/2015  . DJD (degenerative joint disease) 08/22/2015  . Fibromyalgia 08/22/2015  . H/O degenerative disc disease   . Lupus (systemic lupus erythematosus) ([place])   . Osteoarthritis 08/22/2015  . Pain medication agreement signed 09/05/2015   Overview:  Patient signed pain contract with [facility] on September 05, 2015 and is only to receive opiate medications from [facility].  . Raynaud's phenomenon 08/22/2015  . SLE (systemic lupus erythematosus) ([place]) 08/22/2015    Past Surgical History:  Procedure Laterality Date  . ABDOMINAL SURGERY    . HERNIA REPAIR      Prior to Admission medications   Medication Sig Start Date End Date Taking? Authorizing Provider  hydroxychloroquine (PLAQUENIL) 200 MG tablet Take 200 mg by mouth 2 (two) times daily.  01/27/16  Yes Historical Provider, MD  ondansetron (ZOFRAN ODT) 4 MG disintegrating tablet Take 1 tablet (4 mg total) by mouth every 8 (eight) hours as needed for nausea or vomiting. 01/27/16  Yes Harvest Dark, MD  oxyCODONE-acetaminophen (ROXICET) 5-325 MG tablet Take 1 tablet by mouth every 6 (six) hours as needed. 01/27/16  Yes Harvest Dark, MD  pantoprazole (PROTONIX) 20 MG tablet Take 1 tablet (20 mg total) by mouth daily. 01/27/16 01/26/17 Yes Harvest Dark, MD  PROAIR HFA 108 [id number] Base) MCG/ACT inhaler Inhale 1-2 puffs into the lungs every 6 (six) hours as needed.  01/08/16  Yes Historical Provider, MD  promethazine (PHENERGAN) 12.5 MG tablet Take 12.5 mg by mouth every 6 (six) hours as needed.   01/27/16  Yes Historical Provider, MD  sertraline (ZOLOFT) 100 MG tablet Take 1.5 tablets by mouth every evening. 01/08/16  Yes Historical Provider, MD  sertraline (ZOLOFT) 50 MG tablet  10/22/15  Yes Historical Provider, MD  traZODone (DESYREL) 50 MG tablet Take 100 mg by mouth. 01/14/16  Yes Historical Provider, MD  acetaminophen (TYLENOL) 500 MG tablet Take 1,000 mg by mouth.    Historical Provider, MD  aspirin EC 81 MG tablet Take 81 mg by mouth daily. 01/05/06   Historical Provider, MD  CELEBREX 100 MG capsule Take 1 capsule by mouth daily. 01/27/16   Historical Provider, MD  ibuprofen (ADVIL,MOTRIN) 400 MG tablet Take 400 mg by mouth every 8 (eight) hours as needed.    Historical Provider, MD  levETIRAcetam (KEPPRA) 250 MG tablet Take 250 mg by mouth 3 (three) times daily.  01/27/16   Historical Provider, MD  omeprazole (PRILOSEC) 20 MG capsule Take 20 mg by mouth daily. 08/22/15   Historical Provider, MD  polyethylene glycol (GOLYTELY) 236 g solution Drink one 8 oz glass every 20 mins until stools are clear. 01/29/16   [name], MD    Allergies as of 01/29/2016 - Review Complete 01/29/2016  Allergen Reaction Noted  . Codeine Nausea Only 12/05/2014  . Nortriptyline Other (See Comments) 08/15/2012  . Hydrocodone Nausea And Vomiting 08/15/2012    Family History  Problem Relation Age of Onset  . Heart disease Mother   . Hyperlipidemia Mother   . Hypertension Mother   . Heart disease Father   .  Hyperlipidemia Father   . Hypertension Father   . Heart disease Brother   . Hyperlipidemia Brother   . Diabetes Brother     Social History   Social History  . Marital status: Single    Spouse name: N/A  . Number of children: N/A  . Years of education: N/A   Occupational History  . Not on file.   Social History Main Topics  . Smoking status: Current Every Day Smoker    Packs/day: 1.00    Types: Cigarettes  . Smokeless tobacco: Never Used  . Alcohol use Yes     Comment:  occasional consumption  . Drug use: No  . Sexual activity: Not on file   Other Topics Concern  . Not on file   Social History Narrative  . No narrative on file    Review of Systems: See HPI, otherwise negative ROS  Physical Exam: BP 108/70   Pulse 92   Temp 97.1 F (36.2 C) (Tympanic)   Resp 18   Ht 5\' 6"  (1.676 m)   Wt 143 lb (64.9 kg)   SpO2 99%   BMI 23.08 kg/m  General:   Alert,  pleasant and cooperative in NAD Head:  Normocephalic and atraumatic. Neck:  Supple; no masses or thyromegaly. Lungs:  Clear throughout to auscultation.    Heart:  Regular rate and rhythm. Abdomen:  Soft, nontender and nondistended. Normal bowel sounds, without guarding, and without rebound.   Neurologic:  Alert and  oriented x4;  grossly normal neurologically.  Impression/Plan: Jason Cantu is here for an endoscopy and colonoscopy to be performed for evaluation of recent bout of diverticulitis, nausea, abdominal pain while on Celebrek. Presently has no abdominal pain, nausea or vomiting after he d/c celebrex and use of amrijuana  Risks, benefits, limitations, and alternatives regarding  endoscopy and colonoscopy have been reviewed with the patient.  Questions have been answered.  All parties agreeable.   [name], MD  03/13/2016, 7:34 AM

## 2016-03-13 NOTE — Anesthesia Procedure Notes (Signed)
Date/Time: 03/13/2016 7:50 AM Performed by: Nelda Marseille Pre-anesthesia Checklist: Patient identified, Emergency Drugs available, Suction available, Patient being monitored and Timeout performed Oxygen Delivery Method: Nasal cannula

## 2016-03-13 NOTE — Transfer of Care (Signed)
Immediate Anesthesia Transfer of Care Note  Patient: Jason Cantu  Procedure(s) Performed: Procedure(s): COLONOSCOPY WITH PROPOFOL (N/A) ESOPHAGOGASTRODUODENOSCOPY (EGD) WITH PROPOFOL (N/A)  Patient Location: PACU  Anesthesia Type:General  Level of Consciousness: awake and sedated  Airway & Oxygen Therapy: Patient Spontanous Breathing and Patient connected to nasal cannula oxygen  Post-op Assessment: Report given to RN and Post -op Vital signs reviewed and stable  Post vital signs: Reviewed and stable  Last Vitals:  Vitals:   03/13/16 0723  BP: 108/70  Pulse: 92  Resp: 18  Temp: 36.2 C    Last Pain:  Vitals:   03/13/16 0723  TempSrc: Tympanic  PainSc: 4          Complications: No apparent anesthesia complications

## 2016-03-13 NOTE — Anesthesia Preprocedure Evaluation (Signed)
Anesthesia Evaluation  Patient identified by MRN, date of birth, ID band Patient awake    Reviewed: Allergy & Precautions, NPO status , Patient's Chart, lab work & pertinent test results  History of Anesthesia Complications Negative for: history of anesthetic complications  Airway Mallampati: II  TM Distance: >3 FB Neck ROM: Full    Dental no notable dental hx.    Pulmonary neg sleep apnea, COPD,  COPD inhaler, Current Smoker,    breath sounds clear to auscultation- rhonchi (-) wheezing      Cardiovascular Exercise Tolerance: Good (-) hypertension(-) CAD and (-) Past MI  Rhythm:Regular Rate:Normal - Systolic murmurs and - Diastolic murmurs    Neuro/Psych negative neurological ROS  negative psych ROS   GI/Hepatic negative GI ROS, Neg liver ROS,   Endo/Other  negative endocrine ROSneg diabetes  Renal/GU negative Renal ROS     Musculoskeletal  (+) Arthritis , Fibromyalgia -  Abdominal (+) - obese,   Peds  Hematology negative hematology ROS (+)   Anesthesia Other Findings Past Medical History: No date: Arthritis 08/22/2015: COPD (chronic obstructive pulmonary disease) (* 08/22/2015: DJD (degenerative joint disease) 08/22/2015: Fibromyalgia No date: H/O degenerative disc disease No date: Lupus (systemic lupus erythematosus) ([place]) 08/22/2015: Osteoarthritis 09/05/2015: Pain medication agreement signed     Comment: Overview:  Patient signed pain contract with               [facility] on September 05, 2015 and is only to receive opiate medications               from [facility]. 08/22/2015: Raynaud's phenomenon 08/22/2015: SLE (systemic lupus erythematosus) ([place])   Reproductive/Obstetrics                             Anesthesia Physical Anesthesia Plan  ASA: II  Anesthesia Plan: General   Post-op Pain Management:    Induction: Intravenous  Airway Management  Planned: Natural Airway  Additional Equipment:   Intra-op Plan:   Post-operative Plan:   Informed Consent: I have reviewed the patients History and Physical, chart, labs and discussed the procedure including the risks, benefits and alternatives for the proposed anesthesia with the patient or authorized representative who has indicated his/her understanding and acceptance.   Dental advisory given  Plan Discussed with: CRNA and Anesthesiologist  Anesthesia Plan Comments:         Anesthesia Quick Evaluation

## 2016-03-16 ENCOUNTER — Encounter: Payer: Self-pay | Admitting: Gastroenterology

## 2016-03-16 LAB — SURGICAL PATHOLOGY

## 2016-03-25 ENCOUNTER — Telehealth: Payer: Self-pay

## 2016-03-25 ENCOUNTER — Other Ambulatory Visit: Payer: Self-pay

## 2016-03-25 MED ORDER — PANTOPRAZOLE SODIUM 20 MG PO TBEC: 20.0000 mg | DELAYED_RELEASE_TABLET | Freq: Every day | ORAL | 3 refills | Status: AC

## 2016-03-25 NOTE — Telephone Encounter (Signed)
-----   Message from [name], MD sent at 03/16/2016  1:42 PM EST ----- Gastritis with no H pylori

## 2016-03-25 NOTE — Telephone Encounter (Signed)
Pt notified of EGD results. Pt rescheduled for colonoscopy on 04/10/16 due to poor prep.   Hx of colon polyps Z86.010  [facility] on 04/10/16 with Dr. Vicente Males

## 2016-03-26 NOTE — Telephone Encounter (Signed)
Patient has [employer]. Pre cert is not required

## 2016-04-07 ENCOUNTER — Telehealth: Payer: Self-pay | Admitting: Gastroenterology

## 2016-04-07 DIAGNOSIS — F32A Depression, unspecified: Secondary | ICD-10-CM | POA: Insufficient documentation

## 2016-04-07 DIAGNOSIS — F329 Major depressive disorder, single episode, unspecified: Secondary | ICD-10-CM | POA: Insufficient documentation

## 2016-04-07 NOTE — Telephone Encounter (Signed)
Needs to change date for colonoscopy  scheduled for this Friday.

## 2016-04-07 NOTE — Telephone Encounter (Signed)
Pt rescheduled his colonoscopy to 04/17/16.

## 2016-04-08 NOTE — Telephone Encounter (Signed)
[facility] notified of rescheduled.

## 2016-05-01 ENCOUNTER — Encounter: Admission: RE | Payer: Self-pay | Source: Ambulatory Visit

## 2016-05-01 ENCOUNTER — Ambulatory Visit: Admission: RE | Admit: 2016-05-01 | Payer: Medicare HMO | Source: Ambulatory Visit | Admitting: Gastroenterology

## 2016-05-01 SURGERY — COLONOSCOPY WITH PROPOFOL
Anesthesia: General

## 2016-05-04 ENCOUNTER — Encounter: Payer: Self-pay | Admitting: Emergency Medicine

## 2016-05-04 ENCOUNTER — Emergency Department
Admission: EM | Admit: 2016-05-04 | Discharge: 2016-05-04 | Disposition: A | Discharge status: Home / Self Care | Payer: Medicare HMO | Attending: Emergency Medicine | Admitting: Emergency Medicine

## 2016-05-04 ENCOUNTER — Emergency Department: Payer: Medicare HMO

## 2016-05-04 DIAGNOSIS — K5732 Diverticulitis of large intestine without perforation or abscess without bleeding: Secondary | ICD-10-CM | POA: Diagnosis not present

## 2016-05-04 DIAGNOSIS — Z79899 Other long term (current) drug therapy: Secondary | ICD-10-CM | POA: Diagnosis not present

## 2016-05-04 DIAGNOSIS — J449 Chronic obstructive pulmonary disease, unspecified: Secondary | ICD-10-CM | POA: Diagnosis not present

## 2016-05-04 DIAGNOSIS — F17210 Nicotine dependence, cigarettes, uncomplicated: Secondary | ICD-10-CM | POA: Diagnosis not present

## 2016-05-04 DIAGNOSIS — R1031 Right lower quadrant pain: Secondary | ICD-10-CM | POA: Diagnosis present

## 2016-05-04 DIAGNOSIS — R109 Unspecified abdominal pain: Secondary | ICD-10-CM

## 2016-05-04 DIAGNOSIS — Z7982 Long term (current) use of aspirin: Secondary | ICD-10-CM | POA: Diagnosis not present

## 2016-05-04 DIAGNOSIS — Z85038 Personal history of other malignant neoplasm of large intestine: Secondary | ICD-10-CM | POA: Insufficient documentation

## 2016-05-04 LAB — URINALYSIS, COMPLETE (UACMP) WITH MICROSCOPIC
BACTERIA UA: NONE SEEN
BILIRUBIN URINE: NEGATIVE
GLUCOSE, UA: NEGATIVE mg/dL
HGB URINE DIPSTICK: NEGATIVE
Ketones, ur: NEGATIVE mg/dL
LEUKOCYTES UA: NEGATIVE
Nitrite: NEGATIVE
Protein, ur: 30 mg/dL — AB
SPECIFIC GRAVITY, URINE: 1.02 (ref 1.005–1.030)
SQUAMOUS EPITHELIAL / LPF: NONE SEEN
pH: 6 (ref 5.0–8.0)

## 2016-05-04 LAB — COMPREHENSIVE METABOLIC PANEL
ALT: 15 U/L — ABNORMAL LOW (ref 17–63)
ANION GAP: 8 (ref 5–15)
AST: 21 U/L (ref 15–41)
Albumin: 4.4 g/dL (ref 3.5–5.0)
Alkaline Phosphatase: 62 U/L (ref 38–126)
BUN: 8 mg/dL (ref 6–20)
CHLORIDE: 105 mmol/L (ref 101–111)
CO2: 25 mmol/L (ref 22–32)
Calcium: 10.6 mg/dL — ABNORMAL HIGH (ref 8.9–10.3)
Creatinine, Ser: 0.95 mg/dL (ref 0.61–1.24)
Glucose, Bld: 127 mg/dL — ABNORMAL HIGH (ref 65–99)
POTASSIUM: 3.9 mmol/L (ref 3.5–5.1)
Sodium: 138 mmol/L (ref 135–145)
Total Bilirubin: 0.8 mg/dL (ref 0.3–1.2)
Total Protein: 7.9 g/dL (ref 6.5–8.1)

## 2016-05-04 LAB — CBC
HEMATOCRIT: 42.4 % (ref 40.0–52.0)
Hemoglobin: 15.3 g/dL (ref 13.0–18.0)
MCH: 35.1 pg — ABNORMAL HIGH (ref 26.0–34.0)
MCHC: 36.1 g/dL — ABNORMAL HIGH (ref 32.0–36.0)
MCV: 97.2 fL (ref 80.0–100.0)
Platelets: 308 10*3/uL (ref 150–440)
RBC: 4.36 MIL/uL — AB (ref 4.40–5.90)
RDW: 13.1 % (ref 11.5–14.5)
WBC: 13.5 10*3/uL — ABNORMAL HIGH (ref 3.8–10.6)

## 2016-05-04 LAB — LIPASE, BLOOD: Lipase: 28 U/L (ref 11–51)

## 2016-05-04 MED ORDER — HYDROMORPHONE HCL 1 MG/ML IJ SOLN
1.0000 mg | Freq: Once | INTRAMUSCULAR | Status: AC
  Administered 2016-05-04: 1 mg via INTRAVENOUS
  Filled 2016-05-04: qty 1

## 2016-05-04 MED ORDER — MORPHINE SULFATE (PF) 4 MG/ML IV SOLN
4.0000 mg | Freq: Once | INTRAVENOUS | Status: AC
  Administered 2016-05-04: 4 mg via INTRAVENOUS
  Filled 2016-05-04: qty 1

## 2016-05-04 MED ORDER — OXYCODONE-ACETAMINOPHEN 5-325 MG PO TABS
1.0000 | ORAL_TABLET | ORAL | Status: DC | PRN
  Administered 2016-05-04: 1 via ORAL
  Filled 2016-05-04: qty 1

## 2016-05-04 MED ORDER — ONDANSETRON HCL 4 MG/2ML IJ SOLN
4.0000 mg | Freq: Once | INTRAMUSCULAR | Status: AC
  Administered 2016-05-04: 4 mg via INTRAVENOUS
  Filled 2016-05-04: qty 2

## 2016-05-04 MED ORDER — SODIUM CHLORIDE 0.9 % IV BOLUS (SEPSIS)
1000.0000 mL | Freq: Once | INTRAVENOUS | Status: AC
  Administered 2016-05-04: 1000 mL via INTRAVENOUS

## 2016-05-04 MED ORDER — ONDANSETRON 4 MG PO TBDP
4.0000 mg | ORAL_TABLET | Freq: Once | ORAL | Status: DC
  Filled 2016-05-04: qty 1

## 2016-05-04 MED ORDER — METRONIDAZOLE 500 MG PO TABS: 500.0000 mg | ORAL_TABLET | Freq: Three times a day (TID) | ORAL | 0 refills | Status: AC

## 2016-05-04 MED ORDER — OXYCODONE-ACETAMINOPHEN 5-325 MG PO TABS: 1.0000 | ORAL_TABLET | Freq: Four times a day (QID) | ORAL | 0 refills | Status: DC | PRN

## 2016-05-04 MED ORDER — IOPAMIDOL (ISOVUE-300) INJECTION 61%
30.0000 mL | Freq: Once | INTRAVENOUS | Status: AC | PRN
  Administered 2016-05-04: 30 mL via ORAL

## 2016-05-04 MED ORDER — IOPAMIDOL (ISOVUE-300) INJECTION 61%
100.0000 mL | Freq: Once | INTRAVENOUS | Status: AC | PRN
  Administered 2016-05-04: 100 mL via INTRAVENOUS

## 2016-05-04 MED ORDER — ONDANSETRON 4 MG PO TBDP: 4.0000 mg | ORAL_TABLET | Freq: Three times a day (TID) | ORAL | 0 refills | Status: DC | PRN

## 2016-05-04 MED ORDER — CIPROFLOXACIN HCL 500 MG PO TABS: 500.0000 mg | ORAL_TABLET | Freq: Two times a day (BID) | ORAL | 0 refills | Status: AC

## 2016-05-04 NOTE — Discharge Instructions (Signed)
Please call your GI medicine doctor today to arrange a follow-up appointment. Please take your antibiotics for the entire course, please take pain and nausea medication as needed, as written. Return to the [facility] department for any worsening abdominal pain, fever, or any other symptom personally concerning to yourself.

## 2016-05-04 NOTE — ED Provider Notes (Signed)
[facility] Emergency Department Provider Note  Time seen: 11:34 AM  I have reviewed the triage vital signs and the nursing notes.   HISTORY  Chief Complaint Abdominal Pain    HPI Jason Cantu is a 51 y.o. male with a past medical history of COPD, fibromyalgia, history of diverticulitis, presents the emergency department for abdominal pain nausea vomiting and diarrhea. According to the patient for the past 3 days she has been experiencing abdominal pain which she states started in the mid abdomen and is now on the mid abdomen as well as right lower quadrant. Denies fever. States nausea and vomiting as well as diarrhea. Denies black or bloody stool. Denies black or bloody vomit. Patient states a history of diverticulitis approximately 3 months ago to which this feels very similar. Patient states he completed his course of antibiotics and has been symptom-free for the past 2 months.Currently describes his discomfort as moderate dull aching pain in the mid abdomen and right lower quadrant.  Past Medical History:  Diagnosis Date  . Arthritis   . COPD (chronic obstructive pulmonary disease) ([place]) 08/22/2015  . DJD (degenerative joint disease) 08/22/2015  . Fibromyalgia 08/22/2015  . H/O degenerative disc disease   . Lupus (systemic lupus erythematosus) ([place])   . Osteoarthritis 08/22/2015  . Pain medication agreement signed 09/05/2015   Overview:  Patient signed pain contract with [facility] on September 05, 2015 and is only to receive opiate medications from [facility].  . Raynaud's phenomenon 08/22/2015  . SLE (systemic lupus erythematosus) ([place]) 08/22/2015    Patient Active Problem List   Diagnosis Date Noted  . Benign neoplasm of ascending colon   . Benign neoplasm of cecum   . Diverticulosis of large intestine without diverticulitis   . Abnormal findings-gastrointestinal tract   . Gastritis   . Stricture of esophagus   . Hiatal hernia   .  Abdominal pain, epigastric   . Acute diverticulitis 01/29/2016  . NSAID long-term use 01/29/2016  . Cannabis use disorder, moderate, dependence ([place]) 01/29/2016  . Nausea and vomiting in adult 01/29/2016  . Diarrhea 01/29/2016  . Pain medication agreement signed 09/05/2015  . COPD (chronic obstructive pulmonary disease) ([place]) 08/22/2015  . DJD (degenerative joint disease) 08/22/2015  . Fibromyalgia 08/22/2015  . Osteoarthritis 08/22/2015  . Raynaud's phenomenon 08/22/2015  . SLE (systemic lupus erythematosus) ([place]) 08/22/2015    Past Surgical History:  Procedure Laterality Date  . ABDOMINAL SURGERY    . COLONOSCOPY WITH PROPOFOL N/A 03/13/2016   Procedure: COLONOSCOPY WITH PROPOFOL;  Surgeon: [name], MD;  Location: [facility] ENDOSCOPY;  Service: Endoscopy;  Laterality: N/A;  . ESOPHAGOGASTRODUODENOSCOPY (EGD) WITH PROPOFOL N/A 03/13/2016   Procedure: ESOPHAGOGASTRODUODENOSCOPY (EGD) WITH PROPOFOL;  Surgeon: [name], MD;  Location: [facility] ENDOSCOPY;  Service: Endoscopy;  Laterality: N/A;  . HERNIA REPAIR      Prior to Admission medications   Medication Sig Start Date End Date Taking? Authorizing Provider  acetaminophen (TYLENOL) 500 MG tablet Take 1,000 mg by mouth.    Historical Provider, MD  aspirin EC 81 MG tablet Take 81 mg by mouth daily. 01/05/06   Historical Provider, MD  hydroxychloroquine (PLAQUENIL) 200 MG tablet Take 200 mg by mouth 2 (two) times daily.  01/27/16   Historical Provider, MD  levETIRAcetam (KEPPRA) 250 MG tablet Take 250 mg by mouth 3 (three) times daily.  01/27/16   Historical Provider, MD  omeprazole (PRILOSEC) 20 MG capsule Take 20 mg by mouth daily. 08/22/15  Historical Provider, MD  ondansetron (ZOFRAN ODT) 4 MG disintegrating tablet Take 1 tablet (4 mg total) by mouth every 8 (eight) hours as needed for nausea or vomiting. 01/27/16   Harvest Dark, MD  oxyCODONE-acetaminophen (ROXICET) 5-325 MG tablet Take 1 tablet by mouth every 6 (six) hours as  needed. 01/27/16   Harvest Dark, MD  pantoprazole (PROTONIX) 20 MG tablet Take 1 tablet (20 mg total) by mouth daily. 03/25/16 03/25/17  [name], MD  polyethylene glycol (GOLYTELY) 236 g solution Drink one 8 oz glass every 20 mins until stools are clear. 01/29/16   [name], MD  PROAIR HFA 108 (90 Base) MCG/ACT inhaler Inhale 1-2 puffs into the lungs every 6 (six) hours as needed.  01/08/16   Historical Provider, MD  promethazine (PHENERGAN) 12.5 MG tablet Take 12.5 mg by mouth every 6 (six) hours as needed.  01/27/16   Historical Provider, MD  sertraline (ZOLOFT) 100 MG tablet Take 1.5 tablets by mouth every evening. 01/08/16   Historical Provider, MD  sertraline (ZOLOFT) 50 MG tablet  10/22/15   Historical Provider, MD  traZODone (DESYREL) 50 MG tablet Take 100 mg by mouth. 01/14/16   Historical Provider, MD    Allergies  Allergen Reactions  . Codeine Nausea Only  . Nortriptyline Other (See Comments)    Other reaction(s): Other (See Comments) Caused body to jerk all over per pt.  . Hydrocodone Nausea And Vomiting    Family History  Problem Relation Age of Onset  . Heart disease Mother   . Hyperlipidemia Mother   . Hypertension Mother   . Heart disease Father   . Hyperlipidemia Father   . Hypertension Father   . Heart disease Brother   . Hyperlipidemia Brother   . Diabetes Brother     Social History Social History  Substance Use Topics  . Smoking status: Current Every Day Smoker    Packs/day: 1.00    Types: Cigarettes  . Smokeless tobacco: Never Used  . Alcohol use Yes     Comment: occasional consumption    Review of Systems Constitutional: Negative for fever. Cardiovascular: Negative for chest pain. Respiratory: Negative for shortness of breath. Gastrointestinal: Abdominal pain. Positive for nausea vomiting and diarrhea. Genitourinary: Negative for dysuria. Neurological: Negative for headache 10-point ROS otherwise  negative.  ____________________________________________   PHYSICAL EXAM:  VITAL SIGNS: ED Triage Vitals  Enc Vitals Group     BP 05/04/16 0856 (!) 131/91     Pulse Rate 05/04/16 0856 75     Resp 05/04/16 0856 18     Temp 05/04/16 0856 97.6 F (36.4 C)     Temp Source 05/04/16 0856 Oral     SpO2 05/04/16 0856 99 %     Weight 05/04/16 0857 143 lb (64.9 kg)     Height 05/04/16 0857 5\' 6"  (1.676 m)     Head Circumference --      Peak Flow --      Pain Score 05/04/16 0823 10     Pain Loc --      Pain Edu? --      Excl. in [place]? --     Constitutional: Alert and oriented. Well appearing and in no distress. Eyes: Normal exam ENT   Head: Normocephalic and atraumatic   Mouth/Throat: Mucous membranes are moist. Cardiovascular: Normal rate, regular rhythm. No murmur Respiratory: Normal respiratory effort without tachypnea nor retractions. Breath sounds are clear  Gastrointestinal: Soft, moderate periumbilical abdominal tenderness to palpation as well as mild right  lower quadrant tenderness palpation. No rebound or guarding. No distention. Musculoskeletal: Nontender with normal range of motion in all extremities. Neurologic:  Normal speech and language. No gross focal neurologic deficits  Skin:  Skin is warm, dry and intact.  Psychiatric: Mood and affect are normal.   ____________________________________________     RADIOLOGY  CT shows persistent/recurrent diverticulitis  ____________________________________________   INITIAL IMPRESSION / ASSESSMENT AND PLAN / ED COURSE  Pertinent labs & imaging results that were available during my care of the patient were reviewed by me and considered in my medical decision making (see chart for details).  Patient presents the emergency department with abdominal pain, nausea vomiting and diarrhea. Patient's labs are largely within normal limits besides a leukocytosis of 13,000. Urinalysis is negative. Given the patient's history of  diverticulitis with complaints of similar discomfort we will obtain a CT scan of the abdomen and pelvis to further evaluate. We will treat the patient's pain and nausea, IV hydrate while awaiting results. Patient agreeable to this plan.  CT shows persistent/recurrent diverticulitis. Patient states he saw a GI doctor but they did not get an adequate prep performed and he is supposed to return to the GI doctor. He will call tomorrow to arrange his appointment. We will start the patient on ciprofloxacin and Flagyl, discharge or pain and nausea medication. I discussed return precautions. Patient is agreeable to this plan.  ____________________________________________   FINAL CLINICAL IMPRESSION(S) / ED DIAGNOSES  Abdominal pain, nausea vomiting diarrhea Diverticulitis   Harvest Dark, MD 05/04/16 1330

## 2016-05-04 NOTE — ED Triage Notes (Signed)
Pt reports lower abdominal pain since Saturday, reports vomiting and diarrhea. Hx of diverticulitis

## 2016-05-04 NOTE — ED Notes (Signed)
Pt returned from CT via stretcher.

## 2016-05-04 NOTE — ED Notes (Signed)
Pt almost finished first bottle. States unable to drink anymore. Called CT and informed them.

## 2016-05-07 ENCOUNTER — Telehealth: Payer: Self-pay | Admitting: Gastroenterology

## 2016-05-07 NOTE — Telephone Encounter (Signed)
Patient needs to reschedule a colonoscopy that he canceled in Dec. He said no one ever called him back.

## 2016-05-08 NOTE — Telephone Encounter (Signed)
Dr. Vicente Males, pt recently went to the ER on 05/04/16 and was dx with recurrent diverticulitis. He is currently taking a 2 week course of antibiotics. Please advise when we can schedule colonoscopy. I told him we usually would like the diverticulitis to heal before scheduling.

## 2016-05-11 NOTE — Telephone Encounter (Signed)
Lets call him in 6 weeks - if pain has resolved then schedule

## 2016-05-13 ENCOUNTER — Other Ambulatory Visit: Payer: Self-pay

## 2016-05-13 NOTE — Telephone Encounter (Signed)
Tried contacting pt back but vm box was not set up.

## 2016-05-14 NOTE — Telephone Encounter (Signed)
Left vm letting pt know Dr. Vicente Males has requested him to wait 6 weeks after he completes antibiotic and pain has resolved. Requested a call back to go ahead and schedule.

## 2016-06-11 DIAGNOSIS — M545 Low back pain, unspecified: Secondary | ICD-10-CM | POA: Insufficient documentation

## 2016-06-11 DIAGNOSIS — G8929 Other chronic pain: Secondary | ICD-10-CM | POA: Insufficient documentation

## 2016-06-11 DIAGNOSIS — K2100 Gastro-esophageal reflux disease with esophagitis, without bleeding: Secondary | ICD-10-CM | POA: Insufficient documentation

## 2016-06-22 ENCOUNTER — Other Ambulatory Visit: Payer: Self-pay

## 2016-06-22 DIAGNOSIS — K5792 Diverticulitis of intestine, part unspecified, without perforation or abscess without bleeding: Secondary | ICD-10-CM

## 2016-06-30 DIAGNOSIS — I7300 Raynaud's syndrome without gangrene: Secondary | ICD-10-CM | POA: Insufficient documentation

## 2016-07-24 ENCOUNTER — Telehealth: Payer: Self-pay | Admitting: Gastroenterology

## 2016-07-24 NOTE — Telephone Encounter (Signed)
Patient called and stated he was in severe pain and needed some medication called in.  He was advised to go to the ER that we didn't have a doctor in the office

## 2016-07-27 ENCOUNTER — Other Ambulatory Visit: Payer: Self-pay

## 2016-07-31 ENCOUNTER — Encounter: Payer: Self-pay | Admitting: *Deleted

## 2016-07-31 ENCOUNTER — Encounter
Admission: RE | Disposition: A | Discharge status: Home / Self Care | Payer: Self-pay | Source: Ambulatory Visit | Attending: Gastroenterology

## 2016-07-31 ENCOUNTER — Ambulatory Visit
Admission: RE | Admit: 2016-07-31 | Discharge: 2016-07-31 | Disposition: A | Discharge status: Home / Self Care | Payer: Medicare HMO | Source: Ambulatory Visit | Attending: Gastroenterology | Admitting: Gastroenterology

## 2016-07-31 ENCOUNTER — Ambulatory Visit: Payer: Medicare HMO | Admitting: Anesthesiology

## 2016-07-31 DIAGNOSIS — D122 Benign neoplasm of ascending colon: Secondary | ICD-10-CM | POA: Diagnosis not present

## 2016-07-31 DIAGNOSIS — D120 Benign neoplasm of cecum: Secondary | ICD-10-CM | POA: Diagnosis not present

## 2016-07-31 DIAGNOSIS — K5730 Diverticulosis of large intestine without perforation or abscess without bleeding: Secondary | ICD-10-CM | POA: Insufficient documentation

## 2016-07-31 DIAGNOSIS — Z7982 Long term (current) use of aspirin: Secondary | ICD-10-CM | POA: Diagnosis not present

## 2016-07-31 DIAGNOSIS — K640 First degree hemorrhoids: Secondary | ICD-10-CM | POA: Diagnosis not present

## 2016-07-31 DIAGNOSIS — D123 Benign neoplasm of transverse colon: Secondary | ICD-10-CM | POA: Insufficient documentation

## 2016-07-31 DIAGNOSIS — K5792 Diverticulitis of intestine, part unspecified, without perforation or abscess without bleeding: Secondary | ICD-10-CM

## 2016-07-31 DIAGNOSIS — M329 Systemic lupus erythematosus, unspecified: Secondary | ICD-10-CM | POA: Insufficient documentation

## 2016-07-31 DIAGNOSIS — Z791 Long term (current) use of non-steroidal anti-inflammatories (NSAID): Secondary | ICD-10-CM | POA: Diagnosis not present

## 2016-07-31 DIAGNOSIS — J449 Chronic obstructive pulmonary disease, unspecified: Secondary | ICD-10-CM | POA: Diagnosis not present

## 2016-07-31 DIAGNOSIS — M797 Fibromyalgia: Secondary | ICD-10-CM | POA: Diagnosis not present

## 2016-07-31 DIAGNOSIS — Z79899 Other long term (current) drug therapy: Secondary | ICD-10-CM | POA: Insufficient documentation

## 2016-07-31 DIAGNOSIS — F17210 Nicotine dependence, cigarettes, uncomplicated: Secondary | ICD-10-CM | POA: Insufficient documentation

## 2016-07-31 DIAGNOSIS — F329 Major depressive disorder, single episode, unspecified: Secondary | ICD-10-CM | POA: Diagnosis not present

## 2016-07-31 HISTORY — PX: COLONOSCOPY WITH PROPOFOL: SHX5780

## 2016-07-31 SURGERY — COLONOSCOPY WITH PROPOFOL
Anesthesia: General

## 2016-07-31 MED ORDER — LIDOCAINE HCL (PF) 2 % IJ SOLN
INTRAMUSCULAR | Status: DC | PRN
  Administered 2016-07-31: 50 mg via INTRADERMAL

## 2016-07-31 MED ORDER — SODIUM CHLORIDE 0.9 % IV SOLN
INTRAVENOUS | Status: DC
  Administered 2016-07-31: 10:00:00 via INTRAVENOUS

## 2016-07-31 MED ORDER — PROPOFOL 500 MG/50ML IV EMUL
INTRAVENOUS | Status: DC | PRN
  Administered 2016-07-31: 150 ug/kg/min via INTRAVENOUS

## 2016-07-31 MED ORDER — SODIUM CHLORIDE 0.9 % IV SOLN
INTRAVENOUS | Status: DC | PRN
  Administered 2016-07-31: 10:00:00 via INTRAVENOUS

## 2016-07-31 MED ORDER — PROPOFOL 10 MG/ML IV BOLUS
INTRAVENOUS | Status: DC | PRN
  Administered 2016-07-31 (×2): 30 mg via INTRAVENOUS
  Administered 2016-07-31: 20 mg via INTRAVENOUS
  Administered 2016-07-31: 50 mg via INTRAVENOUS

## 2016-07-31 NOTE — H&P (Signed)
Jason Bellows MD [address] [name], [place] [address] Phone: [phone] Fax : [phone]  Primary Care Physician:  Jason Drought, MD Primary Gastroenterologist:  Dr. Jonathon Cantu   Pre-Procedure History & Physical: HPI:  Jason Cantu. is a 51 y.o. male is here for an colonoscopy.   Past Medical History:  Diagnosis Date  . Arthritis   . COPD (chronic obstructive pulmonary disease) ([place]) 08/22/2015  . DJD (degenerative joint disease) 08/22/2015  . Fibromyalgia 08/22/2015  . H/O degenerative disc disease   . Lupus (systemic lupus erythematosus) ([place])   . Osteoarthritis 08/22/2015  . Pain medication agreement signed 09/05/2015   Overview:  Patient signed pain contract with [facility] on September 05, 2015 and is only to receive opiate medications from [facility].  . Raynaud's phenomenon 08/22/2015  . SLE (systemic lupus erythematosus) ([place]) 08/22/2015    Past Surgical History:  Procedure Laterality Date  . ABDOMINAL SURGERY    . COLONOSCOPY WITH PROPOFOL N/A 03/13/2016   Procedure: COLONOSCOPY WITH PROPOFOL;  Surgeon: Jason Bellows, MD;  Location: [facility] ENDOSCOPY;  Service: Endoscopy;  Laterality: N/A;  . ESOPHAGOGASTRODUODENOSCOPY (EGD) WITH PROPOFOL N/A 03/13/2016   Procedure: ESOPHAGOGASTRODUODENOSCOPY (EGD) WITH PROPOFOL;  Surgeon: Jason Bellows, MD;  Location: [facility] ENDOSCOPY;  Service: Endoscopy;  Laterality: N/A;  . HERNIA REPAIR      Prior to Admission medications   Medication Sig Start Date End Date Taking? Authorizing Provider  acetaminophen (TYLENOL) 500 MG tablet Take 1,000 mg by mouth.   Yes Historical Provider, MD  aspirin EC 81 MG tablet Take 81 mg by mouth daily. 01/05/06  Yes Historical Provider, MD  busPIRone (BUSPAR) 10 MG tablet Take by mouth.   Yes Historical Provider, MD  gabapentin (NEURONTIN) 300 MG capsule Take by mouth.   Yes Historical Provider, MD  hydroxychloroquine (PLAQUENIL) 200 MG tablet Take 200 mg by mouth 2 (two) times daily.   01/27/16  Yes Historical Provider, MD  oxycodone (OXY-IR) 5 MG capsule Take 5 mg by mouth. 07/26/16  Yes Historical Provider, MD  pantoprazole (PROTONIX) 20 MG tablet Take 1 tablet (20 mg total) by mouth daily. 03/25/16 03/25/17 Yes Jason Bellows, MD  sertraline (ZOLOFT) 100 MG tablet Take 200 tablets by mouth at bedtime.  01/08/16  Yes Historical Provider, MD  traZODone (DESYREL) 50 MG tablet Take 100 mg by mouth. 01/14/16  Yes Historical Provider, MD  ibuprofen (ADVIL,MOTRIN) 400 MG tablet Take 400 mg by mouth.    Historical Provider, MD  ondansetron (ZOFRAN ODT) 4 MG disintegrating tablet Take 1 tablet (4 mg total) by mouth every 8 (eight) hours as needed for nausea or vomiting. 05/04/16   Harvest Dark, MD  oxyCODONE-acetaminophen (ROXICET) 5-325 MG tablet Take 1 tablet by mouth every 6 (six) hours as needed. 05/04/16   Harvest Dark, MD  polyethylene glycol (GOLYTELY) 236 g solution Drink one 8 oz glass every 20 mins until stools are clear. Patient not taking: Reported on 05/04/2016 01/29/16   Jason Bellows, MD  PROAIR HFA 108 [phone] Base) MCG/ACT inhaler Inhale 1-2 puffs into the lungs every 6 (six) hours as needed.  01/08/16   Historical Provider, MD  promethazine (PHENERGAN) 12.5 MG tablet Take by mouth.    Historical Provider, MD    Allergies as of 06/22/2016 - Review Complete 05/04/2016  Allergen Reaction Noted  . Celebrex [celecoxib] Other (See Comments) 05/04/2016  . Codeine Nausea Only 12/05/2014  . Nortriptyline Other (See Comments) 08/15/2012  . Hydrocodone Nausea And Vomiting 08/15/2012  Family History  Problem Relation Age of Onset  . Heart disease Mother   . Hyperlipidemia Mother   . Hypertension Mother   . Heart disease Father   . Hyperlipidemia Father   . Hypertension Father   . Heart disease Brother   . Hyperlipidemia Brother   . Diabetes Brother     Social History   Social History  . Marital status: Single    Spouse name: N/A  . Number of children: N/A  .  Years of education: N/A   Occupational History  . Not on file.   Social History Main Topics  . Smoking status: Current Every Day Smoker    Packs/day: 1.00    Types: Cigarettes  . Smokeless tobacco: Never Used  . Alcohol use Yes     Comment: occasional consumption  . Drug use: No  . Sexual activity: Not on file   Other Topics Concern  . Not on file   Social History Narrative  . No narrative on file    Review of Systems: See HPI, otherwise negative ROS  Physical Exam: There were no vitals taken for this visit. General:   Alert,  pleasant and cooperative in NAD Head:  Normocephalic and atraumatic. Neck:  Supple; no masses or thyromegaly. Lungs:  Clear throughout to auscultation.    Heart:  Regular rate and rhythm. Abdomen:  Soft, nontender and nondistended. Normal bowel sounds, without guarding, and without rebound.   Neurologic:  Alert and  oriented x4;  grossly normal neurologically.  Impression/Plan: Jason Cantu. is here for an colonoscopy to be performed for diverticulitis   Risks, benefits, limitations, and alternatives regarding  colonoscopy have been reviewed with the patient.  Questions have been answered.  All parties agreeable.   Jason Bellows, MD  07/31/2016, 9:53 AM

## 2016-07-31 NOTE — Anesthesia Preprocedure Evaluation (Addendum)
Anesthesia Evaluation  Patient identified by MRN, date of birth, ID band Patient awake    Reviewed: Allergy & Precautions, NPO status , Patient's Chart, lab work & pertinent test results  History of Anesthesia Complications Negative for: history of anesthetic complications  Airway Mallampati: II  TM Distance: >3 FB Neck ROM: Full    Dental  (+) Poor Dentition   Pulmonary neg sleep apnea, COPD,  COPD inhaler, Current Smoker,    breath sounds clear to auscultation- rhonchi (-) wheezing      Cardiovascular Exercise Tolerance: Good (-) hypertension+ Peripheral Vascular Disease  (-) CAD and (-) Past MI  Rhythm:Regular Rate:Normal - Systolic murmurs and - Diastolic murmurs    Neuro/Psych PSYCHIATRIC DISORDERS Depression negative neurological ROS     GI/Hepatic Neg liver ROS, hiatal hernia,   Endo/Other  negative endocrine ROSneg diabetes  Renal/GU negative Renal ROS     Musculoskeletal  (+) Arthritis , Fibromyalgia -  Abdominal (+) - obese,   Peds  Hematology negative hematology ROS (+)   Anesthesia Other Findings Past Medical History: No date: Arthritis 08/22/2015: COPD (chronic obstructive pulmonary disease) (* 08/22/2015: DJD (degenerative joint disease) 08/22/2015: Fibromyalgia No date: H/O degenerative disc disease No date: Lupus (systemic lupus erythematosus) ([place]) 08/22/2015: Osteoarthritis 09/05/2015: Pain medication agreement signed     Comment: Overview:  Patient signed pain contract with               [facility] on September 05, 2015 and is only to receive opiate medications               from [facility]. 08/22/2015: Raynaud's phenomenon 08/22/2015: SLE (systemic lupus erythematosus) ([facility])   Reproductive/Obstetrics                            Anesthesia Physical  Anesthesia Plan  ASA: III  Anesthesia Plan: General   Post-op Pain Management:     Induction: Intravenous  Airway Management Planned: Natural Airway  Additional Equipment:   Intra-op Plan:   Post-operative Plan:   Informed Consent: I have reviewed the patients History and Physical, chart, labs and discussed the procedure including the risks, benefits and alternatives for the proposed anesthesia with the patient or authorized representative who has indicated his/her understanding and acceptance.   Dental advisory given  Plan Discussed with: CRNA and Anesthesiologist  Anesthesia Plan Comments:        Anesthesia Quick Evaluation

## 2016-07-31 NOTE — Anesthesia Post-op Follow-up Note (Signed)
Anesthesia QCDR form completed.        

## 2016-07-31 NOTE — Op Note (Signed)
[facility] Patient Name: Jason Cantu Procedure Date: 07/31/2016 10:39 AM MRN: [id number] Account #: [id number] Date of Birth: 22-Jan-1966 Admit Type: Outpatient Age: 51 Room: [facility] ENDO ROOM 4 Gender: Male Note Status: Finalized Procedure:            Colonoscopy Indications:          Diverticula Providers:            [name] MD, MD Referring MD:         No Local Md, MD (Referring MD) Medicines:            Monitored Anesthesia Care Complications:        No immediate complications. Procedure:            Pre-Anesthesia Assessment:                       - Prior to the procedure, a History and Physical was                        performed, and patient medications, allergies and                        sensitivities were reviewed. The patient's tolerance of                        previous anesthesia was reviewed.                       - The risks and benefits of the procedure and the                        sedation options and risks were discussed with the                        patient. All questions were answered and informed                        consent was obtained.                       - ASA Grade Assessment: III - A patient with severe                        systemic disease.                       After obtaining informed consent, the colonoscope was                        passed under direct vision. Throughout the procedure,                        the patient's blood pressure, pulse, and oxygen                        saturations were monitored continuously. The                        Colonoscope was introduced through the anus and                        advanced to  the the cecum, identified by the                        appendiceal orifice, IC valve and transillumination.                        The colonoscopy was performed with ease. The patient                        tolerated the procedure well. The quality of the bowel           preparation was poor. Findings:      The perianal and digital rectal examinations were normal.      Multiple small-mouthed diverticula were found in the sigmoid colon.      Non-bleeding internal hemorrhoids were found during retroflexion. The       hemorrhoids were small and Grade I (internal hemorrhoids that do not       prolapse).      Four sessile polyps were found in the transverse colon, ascending colon       and cecum. The polyps were 5 to 7 mm in size. These polyps were removed       with a cold snare. Resection and retrieval were complete.      A 8 mm polyp was found in the ascending colon. The polyp was       semi-sessile. The polyp was removed with a piecemeal technique using a       hot snare. Resection and retrieval were complete. Impression:           - Preparation of the colon was poor.                       - Diverticulosis in the sigmoid colon.                       - Non-bleeding internal hemorrhoids.                       - Four 5 to 7 mm polyps in the transverse colon, in the                        ascending colon and in the cecum, removed with a cold                        snare. Resected and retrieved.                       - One 8 mm polyp in the ascending colon, removed                        piecemeal using a hot snare. Resected and retrieved. Recommendation:       - Await pathology results.                       - Discharge patient to home (with escort).                       - Resume previous diet.                       - Repeat colonoscopy in 6 months  because the bowel                        preparation was suboptimal. Procedure Code(s):    --- Professional ---                       [phone], Colonoscopy, flexible; with removal of tumor(s),                        polyp(s), or other lesion(s) by snare technique Diagnosis Code(s):    --- Professional ---                       K64.0, First degree hemorrhoids                       K57.30, Diverticulosis of  large intestine without                        perforation or abscess without bleeding                       D12.3, Benign neoplasm of transverse colon (hepatic                        flexure or splenic flexure)                       D12.2, Benign neoplasm of ascending colon                       D12.0, Benign neoplasm of cecum CPT copyright 2016 American Medical Association. All rights reserved. The codes documented in this report are preliminary and upon coder review may  be revised to meet current compliance requirements. [name], MD [name] MD, MD 07/31/2016 11:11:00 AM This report has been signed electronically. Number of Addenda: 0 Note Initiated On: 07/31/2016 10:39 AM Scope Withdrawal Time: 0 hours 19 minutes 11 seconds  Total Procedure Duration: 0 hours 22 minutes 36 seconds       [facility]

## 2016-07-31 NOTE — Anesthesia Postprocedure Evaluation (Signed)
Anesthesia Post Note  Patient: Warnell Rasnic.  Procedure(s) Performed: Procedure(s) (LRB): COLONOSCOPY WITH PROPOFOL (N/A)  Patient location during evaluation: Endoscopy Anesthesia Type: General Level of consciousness: awake and alert and oriented Pain management: pain level controlled Vital Signs Assessment: post-procedure vital signs reviewed and stable Respiratory status: spontaneous breathing, nonlabored ventilation and respiratory function stable Cardiovascular status: blood pressure returned to baseline and stable Postop Assessment: no signs of nausea or vomiting Anesthetic complications: no     Last Vitals:  Vitals:   07/31/16 1135 07/31/16 1145  BP: 109/67 108/73  Pulse: 65 62  Resp: 20 10  Temp:      Last Pain:  Vitals:   07/31/16 1115  TempSrc: Tympanic                 Omayra Tulloch

## 2016-07-31 NOTE — Transfer of Care (Signed)
Immediate Anesthesia Transfer of Care Note  Patient: Jason Cantu.  Procedure(s) Performed: Procedure(s): COLONOSCOPY WITH PROPOFOL (N/A)  Patient Location: Endoscopy Unit  Anesthesia Type:General  Level of Consciousness: awake and alert   Airway & Oxygen Therapy: Patient Spontanous Breathing and Patient connected to nasal cannula oxygen  Post-op Assessment: Report given to RN and Post -op Vital signs reviewed and stable  Post vital signs: Reviewed and stable  Last Vitals:  Vitals:   07/31/16 0955  BP: 113/76  Pulse: 86  Resp: 18  Temp: 36.9 C    Last Pain:  Vitals:   07/31/16 0955  TempSrc: Tympanic         Complications: No apparent anesthesia complications

## 2016-08-03 ENCOUNTER — Encounter: Payer: Self-pay | Admitting: Gastroenterology

## 2016-08-03 LAB — SURGICAL PATHOLOGY

## 2016-08-10 ENCOUNTER — Other Ambulatory Visit: Payer: Self-pay

## 2016-08-10 DIAGNOSIS — Z86010 Personal history of colonic polyps: Secondary | ICD-10-CM

## 2016-08-11 ENCOUNTER — Telehealth: Payer: Self-pay | Admitting: Gastroenterology

## 2016-08-11 NOTE — Telephone Encounter (Signed)
08/11/16 Faxed Prior Auth form to [employer].

## 2016-10-16 ENCOUNTER — Other Ambulatory Visit: Payer: Self-pay

## 2016-10-16 MED ORDER — PROMETHAZINE HCL 12.5 MG PO TABS: 12.5000 mg | ORAL_TABLET | Freq: Four times a day (QID) | ORAL | 0 refills | Status: DC | PRN

## 2016-12-10 ENCOUNTER — Ambulatory Visit: Admission: RE | Admit: 2016-12-10 | Payer: Medicare HMO | Source: Ambulatory Visit | Admitting: Gastroenterology

## 2016-12-10 ENCOUNTER — Encounter: Admission: RE | Payer: Self-pay | Source: Ambulatory Visit

## 2016-12-10 SURGERY — COLONOSCOPY WITH PROPOFOL
Anesthesia: General

## 2016-12-11 ENCOUNTER — Other Ambulatory Visit: Payer: Self-pay

## 2016-12-11 DIAGNOSIS — Z86010 Personal history of colonic polyps: Secondary | ICD-10-CM

## 2016-12-11 MED ORDER — PEG 3350-KCL-NA BICARB-NACL 420 G PO SOLR: 4000.0000 mL | Freq: Once | ORAL | 0 refills | Status: AC

## 2017-01-17 ENCOUNTER — Other Ambulatory Visit: Payer: Self-pay | Admitting: Gastroenterology

## 2017-02-16 ENCOUNTER — Other Ambulatory Visit: Payer: Self-pay | Admitting: Gastroenterology

## 2017-02-22 ENCOUNTER — Encounter: Admission: RE | Payer: Self-pay | Source: Ambulatory Visit

## 2017-02-22 ENCOUNTER — Ambulatory Visit: Admission: RE | Admit: 2017-02-22 | Payer: Medicare HMO | Source: Ambulatory Visit | Admitting: Gastroenterology

## 2017-02-22 SURGERY — COLONOSCOPY WITH PROPOFOL
Anesthesia: General

## 2017-08-04 ENCOUNTER — Other Ambulatory Visit: Payer: Self-pay

## 2017-08-04 ENCOUNTER — Encounter: Payer: Self-pay | Admitting: *Deleted

## 2017-08-04 ENCOUNTER — Emergency Department: Payer: Medicare HMO

## 2017-08-04 ENCOUNTER — Emergency Department
Admission: EM | Admit: 2017-08-04 | Discharge: 2017-08-04 | Disposition: A | Discharge status: Home / Self Care | Payer: Medicare HMO | Attending: Emergency Medicine | Admitting: Emergency Medicine

## 2017-08-04 DIAGNOSIS — Z79899 Other long term (current) drug therapy: Secondary | ICD-10-CM | POA: Diagnosis not present

## 2017-08-04 DIAGNOSIS — F17210 Nicotine dependence, cigarettes, uncomplicated: Secondary | ICD-10-CM | POA: Diagnosis not present

## 2017-08-04 DIAGNOSIS — M79604 Pain in right leg: Secondary | ICD-10-CM

## 2017-08-04 DIAGNOSIS — J449 Chronic obstructive pulmonary disease, unspecified: Secondary | ICD-10-CM | POA: Insufficient documentation

## 2017-08-04 MED ORDER — OXYCODONE-ACETAMINOPHEN 5-325 MG PO TABS
1.0000 | ORAL_TABLET | Freq: Once | ORAL | Status: AC
Start: 2017-08-04 — End: 2017-08-04
  Administered 2017-08-04: 1 via ORAL
  Filled 2017-08-04: qty 1

## 2017-08-04 MED ORDER — OXYCODONE-ACETAMINOPHEN 7.5-325 MG PO TABS: 1.0000 | ORAL_TABLET | Freq: Four times a day (QID) | ORAL | 0 refills | Status: DC | PRN

## 2017-08-04 NOTE — Discharge Instructions (Signed)
Follow discharge care instruction take medication as needed. 

## 2017-08-04 NOTE — ED Notes (Signed)
Pt on the phone with ride, he is on the way. Will pick him up in the front.

## 2017-08-04 NOTE — ED Triage Notes (Signed)
PT to ED reporting right leg pain. PT report shaving twisted leg when falling off a trailer and has since has increased pain in right leg, bruising noted behind right knee and pain when ambulating. No leg swelling, no hx of DVTs or clotting. No blood thinners and no tenderness noted in calf.

## 2017-08-04 NOTE — ED Provider Notes (Signed)
[facility] Emergency Department Provider Note   ____________________________________________   First MD Initiated Contact with Patient 08/04/17 1212     (approximate)  I have reviewed the triage vital signs and the nursing notes.   HISTORY  Chief Complaint Leg Pain    HPI Jason Tumolo. is a 52 y.o. male patient complain of right posterior thigh pain 3 days.  Patient states there was a twisting incident and he fell off a trailer.  Patient noted increased pain to the posterior thigh along with bruising.  Patient state pain increased with ambulation.  Patient denies lower leg swelling.  Patient is not on any blood thinners.  Patient denies calf tenderness.  Patient rates pain as a 10/10.  Patient described pain is "aching".  No palliative measure for complaint.  Past Medical History:  Diagnosis Date  . Arthritis   . COPD (chronic obstructive pulmonary disease) ([place]) 08/22/2015  . DJD (degenerative joint disease) 08/22/2015  . Fibromyalgia 08/22/2015  . H/O degenerative disc disease   . Lupus (systemic lupus erythematosus) ([place])   . Osteoarthritis 08/22/2015  . Pain medication agreement signed 09/05/2015   Overview:  Patient signed pain contract with [facility] on September 05, 2015 and is only to receive opiate medications from [facility].  . Raynaud's phenomenon 08/22/2015  . SLE (systemic lupus erythematosus) ([place]) 08/22/2015    Patient Active Problem List   Diagnosis Date Noted  . Raynaud's disease without gangrene 06/30/2016  . Chronic bilateral low back pain without sciatica 06/11/2016  . Gastroesophageal reflux disease with esophagitis 06/11/2016  . Depression 04/07/2016  . Benign neoplasm of ascending colon   . Benign neoplasm of cecum   . Diverticulosis of large intestine without diverticulitis   . Abnormal findings-gastrointestinal tract   . Gastritis   . Stricture of esophagus   . Hiatal hernia   . Abdominal pain,  epigastric   . Acute diverticulitis 01/29/2016  . NSAID long-term use 01/29/2016  . Cannabis use disorder, moderate, dependence ([place]) 01/29/2016  . Nausea and vomiting in adult 01/29/2016  . Diarrhea 01/29/2016  . Pain medication agreement signed 09/05/2015  . COPD (chronic obstructive pulmonary disease) ([place]) 08/22/2015  . DJD (degenerative joint disease) 08/22/2015  . Fibromyalgia 08/22/2015  . Osteoarthritis 08/22/2015  . Raynaud's phenomenon 08/22/2015  . SLE (systemic lupus erythematosus) ([place]) 08/22/2015    Past Surgical History:  Procedure Laterality Date  . ABDOMINAL SURGERY    . COLONOSCOPY WITH PROPOFOL N/A 03/13/2016   Procedure: COLONOSCOPY WITH PROPOFOL;  Surgeon: [name], MD;  Location: [facility] ENDOSCOPY;  Service: Endoscopy;  Laterality: N/A;  . COLONOSCOPY WITH PROPOFOL N/A 07/31/2016   Procedure: COLONOSCOPY WITH PROPOFOL;  Surgeon: [name], MD;  Location: [facility] ENDOSCOPY;  Service: Endoscopy;  Laterality: N/A;  . ESOPHAGOGASTRODUODENOSCOPY (EGD) WITH PROPOFOL N/A 03/13/2016   Procedure: ESOPHAGOGASTRODUODENOSCOPY (EGD) WITH PROPOFOL;  Surgeon: [name], MD;  Location: [facility] ENDOSCOPY;  Service: Endoscopy;  Laterality: N/A;  . HERNIA REPAIR      Prior to Admission medications   Medication Sig Start Date End Date Taking? Authorizing Provider  acetaminophen (TYLENOL) 500 MG tablet Take 1,000 mg by mouth.    [provider]  aspirin EC 81 MG tablet Take 81 mg by mouth daily. 01/05/06   [provider]  busPIRone (BUSPAR) 10 MG tablet Take by mouth.    [provider]  gabapentin (NEURONTIN) 300 MG capsule Take by mouth.    [provider]  hydroxychloroquine (PLAQUENIL)  200 MG tablet Take 200 mg by mouth 2 (two) times daily.  01/27/16   [provider]  ibuprofen (ADVIL,MOTRIN) 400 MG tablet Take 400 mg by mouth.    [provider]  ondansetron (ZOFRAN ODT) 4 MG disintegrating tablet Take 1 tablet (4 mg total) by  mouth every 8 (eight) hours as needed for nausea or vomiting. 05/04/16   Harvest Dark, MD  oxycodone (OXY-IR) 5 MG capsule Take 5 mg by mouth. 07/26/16   [provider]  oxyCODONE-acetaminophen (PERCOCET) 7.5-325 MG tablet Take 1 tablet by mouth every 6 (six) hours as needed for severe pain. 08/04/17   Sable Feil, PA-C  oxyCODONE-acetaminophen (ROXICET) 5-325 MG tablet Take 1 tablet by mouth every 6 (six) hours as needed. 05/04/16   Harvest Dark, MD  pantoprazole (PROTONIX) 20 MG tablet Take 1 tablet (20 mg total) by mouth daily. 03/25/16 03/25/17  [name], MD  polyethylene glycol (GOLYTELY) 236 g solution Drink one 8 oz glass every 20 mins until stools are clear. Patient not taking: Reported on 05/04/2016 01/29/16   [name], MD  [facility] HFA [id number] [phone] Base) MCG/ACT inhaler Inhale 1-2 puffs into the lungs every 6 (six) hours as needed.  01/08/16   [provider]  promethazine (PHENERGAN) 12.5 MG tablet Take 1 tablet (12.5 mg total) every 6 (six) hours as needed by mouth for nausea or vomiting. 02/16/17 03/18/17  [name], MD  sertraline (ZOLOFT) 100 MG tablet Take 200 tablets by mouth at bedtime.  01/08/16   [provider]  traZODone (DESYREL) 50 MG tablet Take 100 mg by mouth. 01/14/16   [provider]    Allergies Celebrex [celecoxib]; Codeine; Nortriptyline; and Hydrocodone  Family History  Problem Relation Age of Onset  . Heart disease Mother   . Hyperlipidemia Mother   . Hypertension Mother   . Heart disease Father   . Hyperlipidemia Father   . Hypertension Father   . Heart disease Brother   . Hyperlipidemia Brother   . Diabetes Brother     Social History Social History   Tobacco Use  . Smoking status: Current Every Day Smoker    Packs/day: 1.00    Types: Cigarettes  . Smokeless tobacco: Never Used  Substance Use Topics  . Alcohol use: Yes    Comment: occasional consumption  . Drug use: No    Review of  Systems Constitutional: No fever/chills Eyes: No visual changes. ENT: No sore throat. Cardiovascular: Denies chest pain. Respiratory: Denies shortness of breath. Gastrointestinal: No abdominal pain.  No nausea, no vomiting.  No diarrhea.  No constipation. Genitourinary: Negative for dysuria. Musculoskeletal: Degenerative joint disease. Skin: Negative for rash. Neurological: Negative for headaches, focal weakness or numbness. Psychiatric:Depression Endocrine:Lupus, Raynaud, fibromyalgia.  Allergic/Immunilogical: See medication list. ____________________________________________   PHYSICAL EXAM:  VITAL SIGNS: ED Triage Vitals [08/04/17 1143]  Enc Vitals Group     BP 129/90     Pulse Rate 83     Resp 16     Temp 98.2 F (36.8 C)     Temp Source Oral     SpO2 100 %     Weight 140 lb (63.5 kg)     Height 5\' 6"  (1.676 m)     Head Circumference      Peak Flow      Pain Score 10     Pain Loc      Pain Edu?      Excl. in [place]?     Constitutional: Alert  and oriented. Well appearing and in no acute distress. Cardiovascular: Normal rate, regular rhythm. Grossly normal heart sounds.  Good peripheral circulation. Respiratory: Normal respiratory effort.  No retractions. Lungs CTAB. Musculoskeletal: Right lower extremity tenderness and edema.  No joint effusions. Neurologic:  Normal speech and language. No gross focal neurologic deficits are appreciated. No gait instability. Skin:  Skin is warm, dry and intact. No rash noted. Psychiatric: Mood and affect are normal. Speech and behavior are normal.  ____________________________________________   LABS (all labs ordered are listed, but only abnormal results are displayed)  Labs Reviewed - No data to display ____________________________________________  EKG   ____________________________________________  RADIOLOGY    Official radiology report(s): US Venous Img Lower Unilateral Right  Result Date: 08/04/2017 CLINICAL DATA:   Right lower extremity pain and edema with ecchymosis. EXAM: RIGHT LOWER EXTREMITY VENOUS DUPLEX ULTRASOUND TECHNIQUE: Doppler venous assessment of the right lower extremity deep venous system was performed, including characterization of spectral flow, compressibility, and phasicity. COMPARISON:  None. FINDINGS: There is complete compressibility of the right common femoral, femoral, and popliteal veins. Doppler analysis demonstrates respiratory phasicity and augmentation of flow with calf compression. No obvious superficial vein or calf vein thrombosis. IMPRESSION: No evidence of right lower extremity DVT. Electronically Signed   By: Marybelle Killings M.D.   On: 08/04/2017 13:59    ____________________________________________   PROCEDURES  Procedure(s) performed: None  Procedures  Critical Care performed: No  ____________________________________________   INITIAL IMPRESSION / ASSESSMENT AND PLAN / ED COURSE  As part of my medical decision making, I reviewed the following data within the electronic MEDICAL RECORD NUMBER    Right leg pain, edema, and ecchymosis secondary to a fall and twisting incident.  Differential consist of DVT, hematoma, or muscle skeletal pain.  Discussed ultrasound findings with patient.  Patient given discharge care instructions advised to follow-up with PCP as needed.     ____________________________________________   FINAL CLINICAL IMPRESSION(S) / ED DIAGNOSES  Final diagnoses:  Right leg pain     ED Discharge Orders        Ordered    oxyCODONE-acetaminophen (PERCOCET) 7.5-325 MG tablet  Every 6 hours PRN     08/04/17 1422       Note:  This document was prepared using Dragon voice recognition software and may include unintentional dictation errors.    Sable Feil, PA-C 08/04/17 1426    Schuyler Amor, MD 08/04/17 [phone]

## 2017-08-04 NOTE — ED Notes (Signed)
Pt ambualtory to lobby, waiting on ride , first RN aware

## 2018-01-13 ENCOUNTER — Ambulatory Visit: Payer: Medicare HMO | Admitting: Nurse Practitioner

## 2020-04-18 ENCOUNTER — Other Ambulatory Visit: Payer: Self-pay

## 2020-04-18 ENCOUNTER — Emergency Department: Payer: Medicare Other

## 2020-04-18 ENCOUNTER — Emergency Department
Admission: EM | Admit: 2020-04-18 | Discharge: 2020-04-18 | Disposition: A | Discharge status: Home / Self Care | Payer: Medicare Other | Attending: Emergency Medicine | Admitting: Emergency Medicine

## 2020-04-18 DIAGNOSIS — S61411A Laceration without foreign body of right hand, initial encounter: Secondary | ICD-10-CM | POA: Diagnosis not present

## 2020-04-18 DIAGNOSIS — Y901 Blood alcohol level of 20-39 mg/100 ml: Secondary | ICD-10-CM

## 2020-04-18 DIAGNOSIS — F1010 Alcohol abuse, uncomplicated: Secondary | ICD-10-CM | POA: Diagnosis not present

## 2020-04-18 DIAGNOSIS — M542 Cervicalgia: Secondary | ICD-10-CM | POA: Insufficient documentation

## 2020-04-18 DIAGNOSIS — T07XXXA Unspecified multiple injuries, initial encounter: Secondary | ICD-10-CM

## 2020-04-18 DIAGNOSIS — F17210 Nicotine dependence, cigarettes, uncomplicated: Secondary | ICD-10-CM | POA: Diagnosis not present

## 2020-04-18 DIAGNOSIS — M549 Dorsalgia, unspecified: Secondary | ICD-10-CM | POA: Diagnosis not present

## 2020-04-18 DIAGNOSIS — S92912A Unspecified fracture of left toe(s), initial encounter for closed fracture: Secondary | ICD-10-CM

## 2020-04-18 DIAGNOSIS — R079 Chest pain, unspecified: Secondary | ICD-10-CM | POA: Insufficient documentation

## 2020-04-18 DIAGNOSIS — Y92410 Unspecified street and highway as the place of occurrence of the external cause: Secondary | ICD-10-CM | POA: Diagnosis not present

## 2020-04-18 DIAGNOSIS — Z85038 Personal history of other malignant neoplasm of large intestine: Secondary | ICD-10-CM | POA: Insufficient documentation

## 2020-04-18 DIAGNOSIS — Z7982 Long term (current) use of aspirin: Secondary | ICD-10-CM | POA: Diagnosis not present

## 2020-04-18 DIAGNOSIS — Z716 Tobacco abuse counseling: Secondary | ICD-10-CM

## 2020-04-18 DIAGNOSIS — R519 Headache, unspecified: Secondary | ICD-10-CM | POA: Diagnosis not present

## 2020-04-18 DIAGNOSIS — S92425A Nondisplaced fracture of distal phalanx of left great toe, initial encounter for closed fracture: Secondary | ICD-10-CM | POA: Insufficient documentation

## 2020-04-18 DIAGNOSIS — J449 Chronic obstructive pulmonary disease, unspecified: Secondary | ICD-10-CM | POA: Insufficient documentation

## 2020-04-18 DIAGNOSIS — S8992XA Unspecified injury of left lower leg, initial encounter: Secondary | ICD-10-CM | POA: Diagnosis present

## 2020-04-18 LAB — CBC
HCT: 43.5 % (ref 39.0–52.0)
Hemoglobin: 14.9 g/dL (ref 13.0–17.0)
MCH: 33.6 pg (ref 26.0–34.0)
MCHC: 34.3 g/dL (ref 30.0–36.0)
MCV: 98 fL (ref 80.0–100.0)
Platelets: 282 10*3/uL (ref 150–400)
RBC: 4.44 MIL/uL (ref 4.22–5.81)
RDW: 12.7 % (ref 11.5–15.5)
WBC: 13.9 10*3/uL — ABNORMAL HIGH (ref 4.0–10.5)
nRBC: 0 % (ref 0.0–0.2)

## 2020-04-18 LAB — COMPREHENSIVE METABOLIC PANEL
ALT: 29 U/L (ref 0–44)
AST: 67 U/L — ABNORMAL HIGH (ref 15–41)
Albumin: 4.3 g/dL (ref 3.5–5.0)
Alkaline Phosphatase: 58 U/L (ref 38–126)
Anion gap: 11 (ref 5–15)
BUN: 7 mg/dL (ref 6–20)
CO2: 25 mmol/L (ref 22–32)
Calcium: 9.3 mg/dL (ref 8.9–10.3)
Chloride: 103 mmol/L (ref 98–111)
Creatinine, Ser: 0.66 mg/dL (ref 0.61–1.24)
GFR, Estimated: >60 mL/min (ref >60)
Glucose, Bld: 76 mg/dL (ref 70–99)
Potassium: 4.3 mmol/L (ref 3.5–5.1)
Sodium: 139 mmol/L (ref 135–145)
Total Bilirubin: 0.7 mg/dL (ref 0.3–1.2)
Total Protein: 7.7 g/dL (ref 6.5–8.1)

## 2020-04-18 LAB — ETHANOL: Alcohol, Ethyl (B): 26 mg/dL — ABNORMAL HIGH (ref <10)

## 2020-04-18 LAB — PROTIME-INR
INR: 1 (ref 0.8–1.2)
Prothrombin Time: 12.3 seconds (ref 11.4–15.2)

## 2020-04-18 LAB — SAMPLE TO BLOOD BANK

## 2020-04-18 LAB — TROPONIN I (HIGH SENSITIVITY): Troponin I (High Sensitivity): 4 ng/L (ref <18)

## 2020-04-18 LAB — LACTIC ACID, PLASMA: Lactic Acid, Venous: 1.2 mmol/L (ref 0.5–1.9)

## 2020-04-18 MED ORDER — KETOROLAC TROMETHAMINE 30 MG/ML IJ SOLN
30.0000 mg | Freq: Once | INTRAMUSCULAR | Status: AC
  Administered 2020-04-18: 30 mg via INTRAVENOUS
  Filled 2020-04-18: qty 1

## 2020-04-18 MED ORDER — LIDOCAINE-EPINEPHRINE 2 %-1:100000 IJ SOLN
30.0000 mL | Freq: Once | INTRAMUSCULAR | Status: AC
  Administered 2020-04-18: 30 mL via INTRADERMAL

## 2020-04-18 MED ORDER — ACETAMINOPHEN 500 MG PO TABS
1000.0000 mg | ORAL_TABLET | Freq: Once | ORAL | Status: AC
  Administered 2020-04-18: 1000 mg via ORAL
  Filled 2020-04-18: qty 2

## 2020-04-18 MED ORDER — OXYCODONE HCL 5 MG PO TABS
5.0000 mg | ORAL_TABLET | Freq: Once | ORAL | Status: AC
  Administered 2020-04-18: 5 mg via ORAL
  Filled 2020-04-18: qty 1

## 2020-04-18 MED ORDER — HYDROMORPHONE HCL 1 MG/ML IJ SOLN
0.5000 mg | Freq: Once | INTRAMUSCULAR | Status: AC
  Administered 2020-04-18: 0.5 mg via INTRAVENOUS
  Filled 2020-04-18: qty 1

## 2020-04-18 NOTE — ED Notes (Signed)
Left big toe buddy taped to second toe. Sick replaced onto pt foot. HOB lowered and pt given pillow for comfort. Lights dimmed. Denies further needs at this time.
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
## 2020-04-18 NOTE — ED Notes (Signed)
Pt is currently irrigating right hand, as per ER provider in sink
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
## 2020-04-18 NOTE — ED Notes (Signed)
Pt ambulatory to restroom without difficulty.
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
## 2023-06-21 NOTE — Telephone Encounter (Signed)
 Called patient to follow up, imaging consistent with polyarticular OA. No signs of SLE on testing. At our next visit, we can discuss potentially coming off HCQ as I have doubts in a SLE diagnosis.

## 2023-10-09 ENCOUNTER — Inpatient Hospital Stay
Admission: EM | Admit: 2023-10-09 | Discharge: 2023-10-11 | DRG: 351 | Disposition: A | Discharge status: Home / Self Care | Attending: Obstetrics and Gynecology | Admitting: Obstetrics and Gynecology

## 2023-10-09 ENCOUNTER — Other Ambulatory Visit: Payer: Self-pay

## 2023-10-09 ENCOUNTER — Emergency Department

## 2023-10-09 ENCOUNTER — Observation Stay

## 2023-10-09 DIAGNOSIS — K4031 Unilateral inguinal hernia, with obstruction, without gangrene, recurrent: Secondary | ICD-10-CM | POA: Diagnosis not present

## 2023-10-09 DIAGNOSIS — Z7982 Long term (current) use of aspirin: Secondary | ICD-10-CM

## 2023-10-09 DIAGNOSIS — R1031 Right lower quadrant pain: Secondary | ICD-10-CM | POA: Diagnosis not present

## 2023-10-09 DIAGNOSIS — R109 Unspecified abdominal pain: Secondary | ICD-10-CM | POA: Diagnosis not present

## 2023-10-09 DIAGNOSIS — K4091 Unilateral inguinal hernia, without obstruction or gangrene, recurrent: Secondary | ICD-10-CM | POA: Diagnosis not present

## 2023-10-09 DIAGNOSIS — K4090 Unilateral inguinal hernia, without obstruction or gangrene, not specified as recurrent: Secondary | ICD-10-CM | POA: Diagnosis not present

## 2023-10-09 DIAGNOSIS — I7300 Raynaud's syndrome without gangrene: Secondary | ICD-10-CM | POA: Diagnosis present

## 2023-10-09 DIAGNOSIS — Z79899 Other long term (current) drug therapy: Secondary | ICD-10-CM

## 2023-10-09 DIAGNOSIS — Z8249 Family history of ischemic heart disease and other diseases of the circulatory system: Secondary | ICD-10-CM

## 2023-10-09 DIAGNOSIS — N200 Calculus of kidney: Secondary | ICD-10-CM | POA: Diagnosis present

## 2023-10-09 DIAGNOSIS — J449 Chronic obstructive pulmonary disease, unspecified: Secondary | ICD-10-CM | POA: Diagnosis present

## 2023-10-09 DIAGNOSIS — F17210 Nicotine dependence, cigarettes, uncomplicated: Secondary | ICD-10-CM | POA: Diagnosis present

## 2023-10-09 DIAGNOSIS — G894 Chronic pain syndrome: Secondary | ICD-10-CM | POA: Diagnosis present

## 2023-10-09 DIAGNOSIS — Z888 Allergy status to other drugs, medicaments and biological substances status: Secondary | ICD-10-CM

## 2023-10-09 DIAGNOSIS — E872 Acidosis, unspecified: Secondary | ICD-10-CM | POA: Diagnosis present

## 2023-10-09 DIAGNOSIS — Z833 Family history of diabetes mellitus: Secondary | ICD-10-CM

## 2023-10-09 DIAGNOSIS — Z791 Long term (current) use of non-steroidal anti-inflammatories (NSAID): Secondary | ICD-10-CM

## 2023-10-09 DIAGNOSIS — M1990 Unspecified osteoarthritis, unspecified site: Secondary | ICD-10-CM | POA: Diagnosis present

## 2023-10-09 DIAGNOSIS — R911 Solitary pulmonary nodule: Secondary | ICD-10-CM | POA: Diagnosis present

## 2023-10-09 DIAGNOSIS — D72829 Elevated white blood cell count, unspecified: Secondary | ICD-10-CM | POA: Diagnosis present

## 2023-10-09 DIAGNOSIS — Z885 Allergy status to narcotic agent status: Secondary | ICD-10-CM

## 2023-10-09 DIAGNOSIS — K8020 Calculus of gallbladder without cholecystitis without obstruction: Secondary | ICD-10-CM | POA: Diagnosis present

## 2023-10-09 DIAGNOSIS — M797 Fibromyalgia: Secondary | ICD-10-CM | POA: Diagnosis present

## 2023-10-09 DIAGNOSIS — M329 Systemic lupus erythematosus, unspecified: Secondary | ICD-10-CM | POA: Diagnosis present

## 2023-10-09 DIAGNOSIS — X500XXA Overexertion from strenuous movement or load, initial encounter: Secondary | ICD-10-CM

## 2023-10-09 DIAGNOSIS — E876 Hypokalemia: Secondary | ICD-10-CM | POA: Diagnosis present

## 2023-10-09 LAB — URINALYSIS, COMPLETE (UACMP) WITH MICROSCOPIC
Bacteria, UA: NONE SEEN
Bilirubin Urine: NEGATIVE
Glucose, UA: NEGATIVE mg/dL
Hgb urine dipstick: NEGATIVE
Ketones, ur: 5 mg/dL — AB
Leukocytes,Ua: NEGATIVE
Nitrite: NEGATIVE
Protein, ur: NEGATIVE mg/dL
Specific Gravity, Urine: 1.033 — ABNORMAL HIGH (ref 1.005–1.030)
Squamous Epithelial / HPF: 0 /HPF (ref 0–5)
pH: 8 (ref 5.0–8.0)

## 2023-10-09 LAB — COMPREHENSIVE METABOLIC PANEL WITH GFR
ALT: 14 U/L (ref 0–44)
AST: 25 U/L (ref 15–41)
Albumin: 4.2 g/dL (ref 3.5–5.0)
Alkaline Phosphatase: 64 U/L (ref 38–126)
Anion gap: 12 (ref 5–15)
BUN: 9 mg/dL (ref 6–20)
CO2: 21 mmol/L — ABNORMAL LOW (ref 22–32)
Calcium: 9.8 mg/dL (ref 8.9–10.3)
Chloride: 103 mmol/L (ref 98–111)
Creatinine, Ser: 0.83 mg/dL (ref 0.61–1.24)
GFR, Estimated: >60 mL/min (ref >60)
Glucose, Bld: 114 mg/dL — ABNORMAL HIGH (ref 70–99)
Potassium: 3 mmol/L — ABNORMAL LOW (ref 3.5–5.1)
Sodium: 136 mmol/L (ref 135–145)
Total Bilirubin: 0.6 mg/dL (ref 0.0–1.2)
Total Protein: 7 g/dL (ref 6.5–8.1)

## 2023-10-09 LAB — BASIC METABOLIC PANEL WITH GFR
Anion gap: 11 (ref 5–15)
BUN: 7 mg/dL (ref 6–20)
CO2: 23 mmol/L (ref 22–32)
Calcium: 9.3 mg/dL (ref 8.9–10.3)
Chloride: 102 mmol/L (ref 98–111)
Creatinine, Ser: 0.76 mg/dL (ref 0.61–1.24)
GFR, Estimated: >60 mL/min (ref >60)
Glucose, Bld: 121 mg/dL — ABNORMAL HIGH (ref 70–99)
Potassium: 3.4 mmol/L — ABNORMAL LOW (ref 3.5–5.1)
Sodium: 136 mmol/L (ref 135–145)

## 2023-10-09 LAB — CBC WITH DIFFERENTIAL/PLATELET
Abs Immature Granulocytes: 0.06 K/uL (ref 0.00–0.07)
Basophils Absolute: 0.1 K/uL (ref 0.0–0.1)
Basophils Relative: 1 %
Eosinophils Absolute: 0.2 K/uL (ref 0.0–0.5)
Eosinophils Relative: 1 %
HCT: 34.9 % — ABNORMAL LOW (ref 39.0–52.0)
Hemoglobin: 11.8 g/dL — ABNORMAL LOW (ref 13.0–17.0)
Immature Granulocytes: 0 %
Lymphocytes Relative: 11 %
Lymphs Abs: 1.8 K/uL (ref 0.7–4.0)
MCH: 31.6 pg (ref 26.0–34.0)
MCHC: 33.8 g/dL (ref 30.0–36.0)
MCV: 93.3 fL (ref 80.0–100.0)
Monocytes Absolute: 1.4 K/uL — ABNORMAL HIGH (ref 0.1–1.0)
Monocytes Relative: 8 %
Neutro Abs: 13.2 K/uL — ABNORMAL HIGH (ref 1.7–7.7)
Neutrophils Relative %: 79 %
Platelets: 320 K/uL (ref 150–400)
RBC: 3.74 MIL/uL — ABNORMAL LOW (ref 4.22–5.81)
RDW: 12.3 % (ref 11.5–15.5)
WBC: 16.6 K/uL — ABNORMAL HIGH (ref 4.0–10.5)
nRBC: 0 % (ref 0.0–0.2)

## 2023-10-09 LAB — URINE DRUG SCREEN, QUALITATIVE (ARMC ONLY)
Amphetamines, Ur Screen: NOT DETECTED
Barbiturates, Ur Screen: NOT DETECTED
Benzodiazepine, Ur Scrn: NOT DETECTED
Cannabinoid 50 Ng, Ur ~~LOC~~: POSITIVE — AB
Cocaine Metabolite,Ur ~~LOC~~: NOT DETECTED
MDMA (Ecstasy)Ur Screen: NOT DETECTED
Methadone Scn, Ur: NOT DETECTED
Opiate, Ur Screen: POSITIVE — AB
Phencyclidine (PCP) Ur S: NOT DETECTED
Tricyclic, Ur Screen: POSITIVE — AB

## 2023-10-09 LAB — CBC
HCT: 36.9 % — ABNORMAL LOW (ref 39.0–52.0)
Hemoglobin: 12.4 g/dL — ABNORMAL LOW (ref 13.0–17.0)
MCH: 31.6 pg (ref 26.0–34.0)
MCHC: 33.6 g/dL (ref 30.0–36.0)
MCV: 93.9 fL (ref 80.0–100.0)
Platelets: 301 K/uL (ref 150–400)
RBC: 3.93 MIL/uL — ABNORMAL LOW (ref 4.22–5.81)
RDW: 12.3 % (ref 11.5–15.5)
WBC: 12.9 K/uL — ABNORMAL HIGH (ref 4.0–10.5)
nRBC: 0 % (ref 0.0–0.2)

## 2023-10-09 LAB — LACTIC ACID, PLASMA
Lactic Acid, Venous: 2 mmol/L (ref 0.5–1.9)
Lactic Acid, Venous: 2.7 mmol/L (ref 0.5–1.9)
Lactic Acid, Venous: 1 mmol/L (ref 0.5–1.9)

## 2023-10-09 LAB — HIV ANTIBODY (ROUTINE TESTING W REFLEX): HIV Screen 4th Generation wRfx: NONREACTIVE

## 2023-10-09 MED ORDER — BISACODYL 5 MG PO TBEC
5.0000 mg | DELAYED_RELEASE_TABLET | Freq: Every day | ORAL | Status: DC | PRN
Start: 2023-10-09 — End: 2023-10-11

## 2023-10-09 MED ORDER — ONDANSETRON HCL 4 MG PO TABS: 4.0000 mg | ORAL_TABLET | Freq: Four times a day (QID) | ORAL | Status: DC | PRN

## 2023-10-09 MED ORDER — ACETAMINOPHEN 500 MG PO TABS
1000.0000 mg | ORAL_TABLET | Freq: Four times a day (QID) | ORAL | Status: DC | PRN
  Filled 2023-10-09: qty 2

## 2023-10-09 MED ORDER — HYDROXYCHLOROQUINE SULFATE 200 MG PO TABS
200.0000 mg | ORAL_TABLET | Freq: Two times a day (BID) | ORAL | Status: DC
  Administered 2023-10-09 – 2023-10-10 (×3): 200 mg via ORAL
  Filled 2023-10-09 (×3): qty 1

## 2023-10-09 MED ORDER — HYDRALAZINE HCL 20 MG/ML IJ SOLN: 5.0000 mg | Freq: Four times a day (QID) | INTRAMUSCULAR | Status: DC | PRN

## 2023-10-09 MED ORDER — ALBUTEROL SULFATE (2.5 MG/3ML) 0.083% IN NEBU: 2.5000 mg | INHALATION_SOLUTION | RESPIRATORY_TRACT | Status: DC | PRN

## 2023-10-09 MED ORDER — BUSPIRONE HCL 10 MG PO TABS
10.0000 mg | ORAL_TABLET | Freq: Two times a day (BID) | ORAL | Status: DC
  Administered 2023-10-09 – 2023-10-11 (×5): 10 mg via ORAL
  Filled 2023-10-09 (×5): qty 1

## 2023-10-09 MED ORDER — POTASSIUM CHLORIDE 10 MEQ/100ML IV SOLN
10.0000 meq | Freq: Once | INTRAVENOUS | Status: AC
  Administered 2023-10-09: 10 meq via INTRAVENOUS
  Filled 2023-10-09: qty 100

## 2023-10-09 MED ORDER — MORPHINE SULFATE (PF) 4 MG/ML IV SOLN
4.0000 mg | Freq: Once | INTRAVENOUS | Status: AC
  Administered 2023-10-09: 4 mg via INTRAVENOUS
  Filled 2023-10-09: qty 1

## 2023-10-09 MED ORDER — IOHEXOL 300 MG/ML  SOLN
100.0000 mL | Freq: Once | INTRAMUSCULAR | Status: AC | PRN
  Administered 2023-10-09: 100 mL via INTRAVENOUS

## 2023-10-09 MED ORDER — PANTOPRAZOLE SODIUM 20 MG PO TBEC
20.0000 mg | DELAYED_RELEASE_TABLET | Freq: Every day | ORAL | Status: DC
Start: 2023-10-09 — End: 2023-10-11
  Administered 2023-10-09 – 2023-10-11 (×3): 20 mg via ORAL
  Filled 2023-10-09 (×3): qty 1

## 2023-10-09 MED ORDER — LACTATED RINGERS IV BOLUS
1000.0000 mL | Freq: Once | INTRAVENOUS | Status: AC
  Administered 2023-10-09: 1000 mL via INTRAVENOUS

## 2023-10-09 MED ORDER — KETOROLAC TROMETHAMINE 15 MG/ML IJ SOLN
15.0000 mg | Freq: Once | INTRAMUSCULAR | Status: AC
  Administered 2023-10-09: 15 mg via INTRAVENOUS
  Filled 2023-10-09: qty 1

## 2023-10-09 MED ORDER — CHLORHEXIDINE GLUCONATE CLOTH 2 % EX PADS
6.0000 | MEDICATED_PAD | Freq: Every day | CUTANEOUS | Status: DC
  Administered 2023-10-10: 6 via TOPICAL

## 2023-10-09 MED ORDER — ONDANSETRON HCL 4 MG/2ML IJ SOLN
4.0000 mg | Freq: Four times a day (QID) | INTRAMUSCULAR | Status: DC | PRN
  Administered 2023-10-09: 4 mg via INTRAVENOUS
  Filled 2023-10-09 (×2): qty 2

## 2023-10-09 MED ORDER — SODIUM CHLORIDE 0.9 % IV SOLN: INTRAVENOUS | Status: AC

## 2023-10-09 MED ORDER — HYDROMORPHONE HCL 1 MG/ML IJ SOLN
1.0000 mg | INTRAMUSCULAR | Status: DC | PRN
  Administered 2023-10-09 – 2023-10-10 (×5): 1 mg via INTRAVENOUS
  Filled 2023-10-09 (×5): qty 1

## 2023-10-09 MED ORDER — KETOROLAC TROMETHAMINE 15 MG/ML IJ SOLN: 15.0000 mg | Freq: Once | INTRAMUSCULAR | Status: DC

## 2023-10-09 MED ORDER — GABAPENTIN 300 MG PO CAPS
300.0000 mg | ORAL_CAPSULE | Freq: Three times a day (TID) | ORAL | Status: DC
  Administered 2023-10-09 – 2023-10-10 (×4): 300 mg via ORAL
  Filled 2023-10-09 (×4): qty 1

## 2023-10-09 MED ORDER — BOOST / RESOURCE BREEZE PO LIQD CUSTOM
1.0000 | Freq: Three times a day (TID) | ORAL | Status: DC
  Administered 2023-10-09 – 2023-10-10 (×3): 1 via ORAL

## 2023-10-09 MED ORDER — HYDROMORPHONE HCL 1 MG/ML IJ SOLN
0.5000 mg | Freq: Once | INTRAMUSCULAR | Status: AC
  Administered 2023-10-09: 0.5 mg via INTRAVENOUS
  Filled 2023-10-09: qty 0.5

## 2023-10-09 MED ORDER — ONDANSETRON HCL 4 MG/2ML IJ SOLN
4.0000 mg | Freq: Once | INTRAMUSCULAR | Status: AC
  Administered 2023-10-09: 4 mg via INTRAVENOUS
  Filled 2023-10-09: qty 2

## 2023-10-09 MED ORDER — ONDANSETRON HCL 4 MG/2ML IJ SOLN: 4.0000 mg | Freq: Once | INTRAMUSCULAR | Status: DC | PRN

## 2023-10-09 MED ORDER — CEFAZOLIN SODIUM-DEXTROSE 2-4 GM/100ML-% IV SOLN
2.0000 g | Freq: Once | INTRAVENOUS | Status: AC
  Administered 2023-10-10: 2 g via INTRAVENOUS
  Filled 2023-10-09: qty 100

## 2023-10-09 MED ORDER — ENOXAPARIN SODIUM 40 MG/0.4ML IJ SOSY
40.0000 mg | PREFILLED_SYRINGE | INTRAMUSCULAR | Status: DC
  Administered 2023-10-09 – 2023-10-11 (×2): 40 mg via SUBCUTANEOUS
  Filled 2023-10-09 (×2): qty 0.4

## 2023-10-09 MED ORDER — ASPIRIN 81 MG PO TBEC
81.0000 mg | DELAYED_RELEASE_TABLET | Freq: Every day | ORAL | Status: DC
Start: 2023-10-09 — End: 2023-10-09
  Administered 2023-10-09: 81 mg via ORAL
  Filled 2023-10-09: qty 1

## 2023-10-09 MED ORDER — DOCUSATE SODIUM 100 MG PO CAPS
100.0000 mg | ORAL_CAPSULE | Freq: Two times a day (BID) | ORAL | Status: DC
  Administered 2023-10-09 – 2023-10-11 (×4): 100 mg via ORAL
  Filled 2023-10-09 (×5): qty 1

## 2023-10-09 NOTE — ED Notes (Signed)
 MD aware pt actively vomiting

## 2023-10-09 NOTE — ED Notes (Signed)
 Acheampong MD at bedside.

## 2023-10-09 NOTE — ED Notes (Signed)
 Tan MD made aware pt lactic 2.0

## 2023-10-09 NOTE — H&P (Signed)
 History and Physical    Patient: Jason Cantu. FMW:[id number] DOB: December 01, 1965 DOA: 10/09/2023 DOS: the patient was seen and examined on 10/09/2023 PCP: Reino Anes, MD  Patient coming from: Home  Chief Complaint: Right groin pain Chief Complaint  Patient presents with   Groin Pain   HPI: Jason Loe. is a 58 y.o. male with medical history significant of multiple inguinal and abdominal hernia status post repair.  Other comorbidities include fibromyalgia, chronic pain syndrome, lupus, arthritis, Raynaud's phenomenon.  She presents to the emergency room with complaints of right inguinal pain and swelling.  Patient reports having carried a heavy load today and thinks he must have popped a hernia.  He was seen and evaluated in the ED, hernia was reduced but patient continues to complain of unbearable pain.  Multiple opiates were offered with fentanyl , morphine , Dilaudid  and Toradol  with minimal effect.  Patient is requesting for more pain medication.  His medical record suggest he follows up with [facility] pain clinic.  He has contractual agreement with same.  Review of Systems: As mentioned in the history of present illness. All other systems reviewed and are negative. Past Medical History:  Diagnosis Date   Arthritis    COPD (chronic obstructive pulmonary disease) (HCC) 08/22/2015   DJD (degenerative joint disease) 08/22/2015   Fibromyalgia 08/22/2015   H/O degenerative disc disease    Lupus (systemic lupus erythematosus) (HCC)    Osteoarthritis 08/22/2015   Pain medication agreement signed 09/05/2015   Overview:  Patient signed pain contract with [facility] on September 05, 2015 and is only to receive opiate medications from [facility].   Raynaud's phenomenon 08/22/2015   SLE (systemic lupus erythematosus) (HCC) 08/22/2015   Past Surgical History:  Procedure Laterality Date   ABDOMINAL SURGERY     COLONOSCOPY WITH PROPOFOL  N/A 03/13/2016   Procedure: COLONOSCOPY WITH  PROPOFOL ;  Surgeon: Ruel Kung, MD;  Location: [facility] ENDOSCOPY;  Service: Endoscopy;  Laterality: N/A;   COLONOSCOPY WITH PROPOFOL  N/A 07/31/2016   Procedure: COLONOSCOPY WITH PROPOFOL ;  Surgeon: Ruel Kung, MD;  Location: [facility] ENDOSCOPY;  Service: Endoscopy;  Laterality: N/A;   ESOPHAGOGASTRODUODENOSCOPY (EGD) WITH PROPOFOL  N/A 03/13/2016   Procedure: ESOPHAGOGASTRODUODENOSCOPY (EGD) WITH PROPOFOL ;  Surgeon: Ruel Kung, MD;  Location: [facility] ENDOSCOPY;  Service: Endoscopy;  Laterality: N/A;   HERNIA REPAIR     Social History:  reports that he has been smoking cigarettes. He has never used smokeless tobacco. He reports current alcohol use. He reports that he does not use drugs.  Allergies  Allergen Reactions   Codeine Nausea Only   Nortriptyline Other (See Comments)    Other reaction(s): Other (See Comments) Caused body to jerk all over per pt.   Trazodone And Nefazodone    Hydrocodone Nausea And Vomiting and Other (See Comments)    Family History  Problem Relation Age of Onset   Heart disease Mother    Hyperlipidemia Mother    Hypertension Mother    Heart disease Father    Hyperlipidemia Father    Hypertension Father    Heart disease Brother    Hyperlipidemia Brother    Diabetes Brother     Prior to Admission medications   Medication Sig Start Date End Date Taking? Authorizing Provider  acetaminophen  (TYLENOL ) 500 MG tablet Take 1,000 mg by mouth.    [provider]  aspirin  EC 81 MG tablet Take 81 mg by mouth daily. 01/05/06   [provider]  busPIRone  (BUSPAR ) 10 MG tablet  Take by mouth.    [provider]  gabapentin  (NEURONTIN ) 300 MG capsule Take by mouth.    [provider]  hydroxychloroquine  (PLAQUENIL ) 200 MG tablet Take 200 mg by mouth 2 (two) times daily.  01/27/16   [provider]  ibuprofen (ADVIL,MOTRIN) 400 MG tablet Take 400 mg by mouth.    [provider]  ondansetron  (ZOFRAN  ODT) 4 MG disintegrating tablet  Take 1 tablet (4 mg total) by mouth every 8 (eight) hours as needed for nausea or vomiting. 05/04/16   Dorothyann Drivers, MD  pantoprazole  (PROTONIX ) 20 MG tablet Take 1 tablet (20 mg total) by mouth daily. 03/25/16 03/25/17  Therisa Bi, MD  PROAIR  HFA 108 (90 Base) MCG/ACT inhaler Inhale 1-2 puffs into the lungs every 6 (six) hours as needed.  01/08/16   [provider]  sertraline  (ZOLOFT ) 100 MG tablet Take 200 tablets by mouth at bedtime.  01/08/16   [provider]  traZODone (DESYREL) 50 MG tablet Take 100 mg by mouth. 01/14/16   [provider]  promethazine  (PHENERGAN ) 12.5 MG tablet Take 1 tablet (12.5 mg total) every 6 (six) hours as needed by mouth for nausea or vomiting. 02/16/17 04/18/20  Therisa Bi, MD    Physical Exam: Vitals:   10/09/23 0130 10/09/23 0230 10/09/23 0330 10/09/23 0400  BP: (!) 153/81 (!) 138/102 (!) 154/81 (!) 146/75  Pulse: 78 62 66 77  Resp:    18  Temp:      TempSrc:      SpO2: 100% 100% 100% 98%  Weight:      Height:       General: Patient is in acute distress.  Requesting for pain medication to knock him out. HEENT: Head is atraumatic.  Pupils equal round reactive to light Neck: Supple with no JVD Chest clinically clear with no added sounds Cardiovascular: Regular S1-S2 with no murmur Abdomen: Right lower quadrant guarding.  Inguinal site without any significant swelling.  Cough reflex was not significantly visible. CNS: Shows no obvious focal deficits. Skin negative for any new rash. Data Reviewed: Lactic acid: 2.7 from 2.0 WBC 136, potassium 3.0, chloride 103, bicarb 21, glucose 114, BUN 9, creatinine 0.83 AST 25, ALT 14 WBC 16.6, hemoglobin 11.8, hematocrit 35, platelet count is 320, neutrophil count 79   CT ABDOMEN PELVIS W CONTRAST IMPRESSION: 1. Small fat containing right inguinal hernia. Correlate clinically for incarceration. 2. Nonobstructive bilateral nephrolithiasis. 3. Cholelithiasis with no CT evidence of  acute cholecystitis. 4. Colonic diverticulosis with no acute diverticulitis. 5. Right solid pulmonary nodule measuring 6 mm. Per Fleischner Society Guidelines, recommend a non-contrast Chest CT at 6-12 months. If patient is high risk for malignancy, consider an additional non-contrast Chest CT at 18-24 months. If patient is low risk for malignancy, non-contrast Chest CT at 18-24 months is optional. These guidelines do not apply to immunocompromised patients and patients with cancer. Follow up in patients with significant comorbidities as clinically warranted. For lung cancer screening, adhere to Lung-RADS guidelines. Reference: Radiology. 2017; 284(1):228-43.  Assessment and Plan: 58 year old gentleman with history of SLE, arthritis, fibromyalgia, chronic pain syndrome.  He presented on account of right inguinal pain.  Found to have developed right-sided hernia.  He attributed it to having carried a heavy load earlier today.  He describes pain as unbearable 10/10.  Hernia was reduced in the ED.  General surgery consulted.  CT abdomen revealed right inguinal hernia with questionable incarceration.  Per ED provider, she was able to reduce hernia.  Unilateral right lower quadrant abdominal pain secondary to inguinal hernia.  Optimize pain management with gabapentin , Flexeril , Tylenol .  Breakthrough pain to be optimized with Dilaudid .  General surgery will be consulted to evaluate.  Elevated lactic acidosis: Patient we will repleted with IV fluids.  Hernia was reduced in the ED.  Unclear if there is any underlying incarceration.  Will consult general surgery to evaluate and advise.  Chronic pain syndrome: Patient is being maintained on gabapentin  900 mg 3 times daily, Tylenol  1000 mg, Flexeril  5 mg twice daily and Celebrex 100 mg daily.  Patient had previously been on Suboxone but self discontinued in 2024.  Hx of SLE: Reported Hx of SLE on basis of serologies and  manifestations, Polyarthralgia.Patient follows up with rheumatology.    Incidental finding of right solid pulmonary nodule measuring 6 mm.  Follow-up CT scan will be scheduled as outpatient.  Incidental finding of cholelithiasis with no evidence of acute cholecystitis.  Nonobstructive bilateral nephrolithiasis were also noted.    Advance Care Planning:   Code Status: Full Code   Consults: General surgery consult  Family Communication: None  Severity of Illness: The appropriate patient status for this patient is OBSERVATION. Observation status is judged to be reasonable and necessary in order to provide the required intensity of service to ensure the patient's safety. The patient's presenting symptoms, physical exam findings, and initial radiographic and laboratory data in the context of their medical condition is felt to place them at decreased risk for further clinical deterioration. Furthermore, it is anticipated that the patient will be medically stable for discharge from the hospital within 2 midnights of admission.   Author: Maude MARLA Dart, MD 10/09/2023 4:29 AM  For on call review [email or web address].

## 2023-10-09 NOTE — ED Notes (Signed)
 Md aware pt requesting Seroquel  be ordered. Per MD, hold until CT results. Pt aware.

## 2023-10-09 NOTE — ED Provider Notes (Signed)
 Jason Cantu Provider Note    Event Date/Time   First MD Initiated Contact with Patient 10/09/23 0032     (approximate)   History   Groin Pain   HPI  Jason Cantu. is a 58 y.o. male with history of COPD, fibromyalgia, lupus, prior left inguinal hernia as well as ventral hernia that have been repaired in the past, presenting with right groin pain.  Patient states that pain started 1 hour prior to presentation, states that he was lifting something heavy, felt something pop out, was in intense pain.  Per EMS he was given 100 mcg of fentanyl .  Patient denies being on any blood thinners.  Was in his usual state of health prior to the the start of the right groin pain.  No nausea or vomiting.  No fevers.  Independent history obtained from EMS as above.  On interview, patient was seen by rheumatology for his lupus, fibromyalgia, main complaints had been about arthropathy.  He has no joint complaints at this time.     Physical Exam   Triage Vital Signs: ED Triage Vitals [10/09/23 0031]  Encounter Vitals Group     BP (!) 151/80     Girls Systolic BP Percentile      Girls Diastolic BP Percentile      Boys Systolic BP Percentile      Boys Diastolic BP Percentile      Pulse Rate 91     Resp 16     Temp 98.1 F (36.7 C)     Temp Source Oral     SpO2 100 %     Weight      Height      Head Circumference      Peak Flow      Pain Score      Pain Loc      Pain Education      Exclude from Growth Chart     Most recent vital signs: Vitals:   10/09/23 0036 10/09/23 0130  BP: (!) 151/80 (!) 153/81  Pulse: 69 78  Resp: 18   Temp:    SpO2: 100% 100%     General: Awake, no distress.  CV:  Good peripheral perfusion.  Resp:  Normal effort.  Abd:  No distention.  Abdomen soft nontender Other:  He does have a swelling to his right groin without overlying skin changes, was able to reduce the hernia   ED Results / Procedures / Treatments    Labs (all labs ordered are listed, but only abnormal results are displayed) Labs Reviewed  COMPREHENSIVE METABOLIC PANEL WITH GFR - Abnormal; Notable for the following components:      Result Value   Potassium 3.0 (*)    CO2 21 (*)    Glucose, Bld 114 (*)    All other components within normal limits  CBC WITH DIFFERENTIAL/PLATELET - Abnormal; Notable for the following components:   WBC 16.6 (*)    RBC 3.74 (*)    Hemoglobin 11.8 (*)    HCT 34.9 (*)    Neutro Abs 13.2 (*)    Monocytes Absolute 1.4 (*)    All other components within normal limits  LACTIC ACID, PLASMA - Abnormal; Notable for the following components:   Lactic Acid, Venous 2.0 (*)    All other components within normal limits  LACTIC ACID, PLASMA     RADIOLOGY On my independent interpretation, CT did not show an obvious obstruction   PROCEDURES:  Critical Care performed: No  Procedures   MEDICATIONS ORDERED IN ED: Medications  potassium chloride  10 mEq in 100 mL IVPB (10 mEq Intravenous New Bag/Given 10/09/23 0207)  HYDROmorphone  (DILAUDID ) injection 0.5 mg (has no administration in time range)  ondansetron  (ZOFRAN ) injection 4 mg (has no administration in time range)  morphine  (PF) 4 MG/ML injection 4 mg (4 mg Intravenous Given 10/09/23 0043)  morphine  (PF) 4 MG/ML injection 4 mg (4 mg Intravenous Given 10/09/23 0117)  ketorolac  (TORADOL ) 15 MG/ML injection 15 mg (15 mg Intravenous Given 10/09/23 0117)  iohexol  (OMNIPAQUE ) 300 MG/ML solution 100 mL (100 mLs Intravenous Contrast Given 10/09/23 0146)  ondansetron  (ZOFRAN ) injection 4 mg (4 mg Intravenous Given 10/09/23 0204)  lactated ringers  bolus 1,000 mL (1,000 mLs Intravenous New Bag/Given 10/09/23 0205)     IMPRESSION / MDM / ASSESSMENT AND PLAN / ED COURSE  I reviewed the triage vital signs and the nursing notes.                              Differential diagnosis includes, but is not limited to, inguinal hernia, incarceration, no overlying skin changes to  suggest strangulation, possible SBO.  Will get labs, lactic acid, will give him some IV morphine  here, ice pack, was able to reduce the hernia at bedside.  Also get a CT abdomen pelvis.  Reassess.  Patient's presentation is most consistent with acute presentation with potential threat to life or bodily function.  Independent interpretation of labs and imaging below.  CT showed small fat-containing right inguinal hernia, on reassessment patient states that he felt something come out again, I was able to reduce the right hernia again.  He still having persistent pain even after the reduction.  He is getting fluids and several rounds of IV pain medications.  Discussed with surgery, Dr. Pabone, says no surgery indicated at this time especially if is reducible.  No evidence of strangulation on CT.  Given multiple rounds of IV pain meds, patient will need to come in for pain control.  Will consult hospitalist for admission.  Consult to hospitalist was agreeable with the plan for admission and will evaluate the patient.  He is admitted.   Clinical Course as of 10/09/23 0235  Sat Oct 09, 2023  0222 CT ABDOMEN PELVIS W CONTRAST IMPRESSION: 1. Small fat containing right inguinal hernia. Correlate clinically for incarceration. 2. Nonobstructive bilateral nephrolithiasis. 3. Cholelithiasis with no CT evidence of acute cholecystitis. 4. Colonic diverticulosis with no acute diverticulitis. 5. Right solid pulmonary nodule measuring 6 mm. Per Fleischner Society Guidelines, recommend a non-contrast Chest CT at 6-12 months. If patient is high risk for malignancy, consider an additional non-contrast Chest CT at 18-24 months. If patient is low risk for malignancy, non-contrast Chest CT at 18-24 months is optional. These guidelines do not apply to immunocompromised patients and patients with cancer. Follow up in patients with significant comorbidities as clinically warranted. For lung cancer screening, adhere to  Lung-RADS guidelines. Reference: Radiology. 2017; 284(1):228-43.   [TT]  0235 Independent review of labs, lactate is mildly elevated, he has a leukocytosis, potassium is low, will replete, rest electrolytes not severely deranged, LFTs are normal. [TT]    Clinical Course User Index [TT] Waymond, Lorelle Cummins, MD     FINAL CLINICAL IMPRESSION(S) / ED DIAGNOSES   Final diagnoses:  Unilateral inguinal hernia without obstruction or gangrene, recurrence not specified  Pulmonary nodule     Rx / DC  Orders   ED Discharge Orders     None        Note:  This document was prepared using Dragon voice recognition software and may include unintentional dictation errors.    Waymond Lorelle Cummins, MD 10/09/23 [phone]

## 2023-10-09 NOTE — ED Triage Notes (Signed)
 States he was lifting a heavy cooler when he started to feel the pressure.

## 2023-10-09 NOTE — H&P (View-Only) (Signed)
 Patient ID: Jason Cantu., male   DOB: 04-17-1965, 58 y.o.   MRN: [id number]  HPI Jason Cantu. is a 58 y.o. male seen in consultation at the request of Dr. Kandis for a symptomatic inguinal hernia.  He reports that he started hurting Yesterday and the pain is moderate to severe intensity sharp and located in the right inguinal region.  No specific alleviating or aggravating factors.  Per ER report from the he had an incarcerated hernia that ER physician was able to reduce.  He actually had to call EMS and he was given 100 mcg of fentanyl    Prior penetrating trauma to his abdominal cavity requiring exploratory laparotomy and major ventral hernia repair at some point.  He also had a prior history of a right inguinal hernia repair does not recall whether or not they used mesh. He did have history of chronic pain and was at some point in time and Suboxone. CT scan pers reviewed showing RIH w/o strangulation or incarceration. No other acute findings. CBC  wbx 12.9, nml hb and nml bmp   HPI  Past Medical History:  Diagnosis Date   Arthritis    COPD (chronic obstructive pulmonary disease) (HCC) 08/22/2015   DJD (degenerative joint disease) 08/22/2015   Fibromyalgia 08/22/2015   H/O degenerative disc disease    Lupus (systemic lupus erythematosus) (HCC)    Osteoarthritis 08/22/2015   Pain medication agreement signed 09/05/2015   Overview:  Patient signed pain contract with [facility] on September 05, 2015 and is only to receive opiate medications from [facility].   Raynaud's phenomenon 08/22/2015   SLE (systemic lupus erythematosus) (HCC) 08/22/2015    Past Surgical History:  Procedure Laterality Date   ABDOMINAL SURGERY     COLONOSCOPY WITH PROPOFOL  N/A 03/13/2016   Procedure: COLONOSCOPY WITH PROPOFOL ;  Surgeon: Ruel Kung, MD;  Location: [facility] ENDOSCOPY;  Service: Endoscopy;  Laterality: N/A;   COLONOSCOPY WITH PROPOFOL  N/A 07/31/2016   Procedure: COLONOSCOPY WITH PROPOFOL ;   Surgeon: Ruel Kung, MD;  Location: [facility] ENDOSCOPY;  Service: Endoscopy;  Laterality: N/A;   ESOPHAGOGASTRODUODENOSCOPY (EGD) WITH PROPOFOL  N/A 03/13/2016   Procedure: ESOPHAGOGASTRODUODENOSCOPY (EGD) WITH PROPOFOL ;  Surgeon: Ruel Kung, MD;  Location: [facility] ENDOSCOPY;  Service: Endoscopy;  Laterality: N/A;   HERNIA REPAIR      Family History  Problem Relation Age of Onset   Heart disease Mother    Hyperlipidemia Mother    Hypertension Mother    Heart disease Father    Hyperlipidemia Father    Hypertension Father    Heart disease Brother    Hyperlipidemia Brother    Diabetes Brother     Social History Social History   Tobacco Use   Smoking status: Every Day    Current packs/day: 1.00    Types: Cigarettes   Smokeless tobacco: Never  Substance Use Topics   Alcohol use: Yes    Comment: occasional consumption   Drug use: No    Allergies  Allergen Reactions   Codeine Nausea Only   Nortriptyline Other (See Comments)    Other reaction(s): Other (See Comments) Caused body to jerk all over per pt.   Trazodone And Nefazodone    Hydrocodone Nausea And Vomiting and Other (See Comments)    Current Facility-Administered Medications  Medication Dose Route Frequency Provider Last Rate Last Admin   0.9 %  sodium chloride  infusion   Intravenous Continuous Acheampong, Peter K, MD 75 mL/hr at 10/09/23 1135 New Bag at 10/09/23 1135  acetaminophen  (TYLENOL ) tablet 1,000 mg  1,000 mg Oral Q6H PRN Acheampong, Peter K, MD       albuterol  (PROVENTIL ) (2.5 MG/3ML) 0.083% nebulizer solution 2.5 mg  2.5 mg Nebulization Q2H PRN Acheampong, Peter K, MD       aspirin  EC tablet 81 mg  81 mg Oral Daily Acheampong, Peter K, MD   81 mg at 10/09/23 1129   bisacodyl  (DULCOLAX) EC tablet 5 mg  5 mg Oral Daily PRN Acheampong, Peter K, MD       busPIRone  (BUSPAR ) tablet 10 mg  10 mg Oral BID Acheampong, Peter K, MD   10 mg at 10/09/23 1129   docusate sodium  (COLACE) capsule 100 mg  100 mg Oral BID  Acheampong, Peter K, MD   100 mg at 10/09/23 1129   enoxaparin  (LOVENOX ) injection 40 mg  40 mg Subcutaneous Q24H Acheampong, Peter K, MD   40 mg at 10/09/23 9148   gabapentin  (NEURONTIN ) capsule 300 mg  300 mg Oral TID Acheampong, Peter K, MD   300 mg at 10/09/23 1129   hydrALAZINE  (APRESOLINE ) injection 5 mg  5 mg Intravenous Q6H PRN Acheampong, Peter K, MD       HYDROmorphone  (DILAUDID ) injection 1 mg  1 mg Intravenous Q4H PRN Acheampong, Peter K, MD   1 mg at 10/09/23 9148   hydroxychloroquine  (PLAQUENIL ) tablet 200 mg  200 mg Oral BID Acheampong, Peter K, MD   200 mg at 10/09/23 1129   ketorolac  (TORADOL ) 15 MG/ML injection 15 mg  15 mg Intravenous Once Acheampong, Peter K, MD       ondansetron  (ZOFRAN ) tablet 4 mg  4 mg Oral Q6H PRN Acheampong, Peter K, MD       Or   ondansetron  (ZOFRAN ) injection 4 mg  4 mg Intravenous Q6H PRN Acheampong, Peter K, MD   4 mg at 10/09/23 0450   pantoprazole  (PROTONIX ) EC tablet 20 mg  20 mg Oral Daily Acheampong, Peter K, MD   20 mg at 10/09/23 1128     Review of Systems Full ROS  was asked and was negative except for the information on the HPI  Physical Exam Blood pressure 101/66, pulse 85, temperature 97.9 F (36.6 C), temperature source Oral, resp. rate 18, height 5' 6 (1.676 m), weight 63.5 kg, SpO2 100%. CONSTITUTIONAL: NAD. EYES: Pupils are equal, round, Sclera are non-icteric. EARS, NOSE, MOUTH AND THROAT: The oropharynx is clear. The oral mucosa is pink and moist. Hearing is intact to voice. LYMPH NODES:  Lymph nodes in the neck are normal. RESPIRATORY:  Lungs are clear. There is normal respiratory effort, with equal breath sounds bilaterally, and without pathologic use of accessory muscles. CARDIOVASCULAR: Heart is regular without murmurs, gallops, or rubs. GI: The abdomen is  soft, there is a hernia in the right inguinal area that is reducible.  When I distracted the patient he was not in too much pain but as soon as he concentrated back on  the exam he exhibited significant pain in his right inguinal region.  There are no palpable masses. There is no hepatosplenomegaly. There are normal bowel sounds He does have a major midline laparotomy scar GU: Rectal deferred.   MUSCULOSKELETAL: Normal muscle strength and tone. No cyanosis or edema.   SKIN: Turgor is good and there are no pathologic skin lesions or ulcers. NEUROLOGIC: Motor and sensation is grossly normal. Cranial nerves are grossly intact. PSYCH:  Oriented to person, place and time. Affect is normal.  Data Reviewed I have  personally reviewed the patient's imaging, laboratory findings and medical records.    Assessment/Plan 58 year old male with a symptomatic right inguinal hernia.  It is interesting to note that his symptoms seems to be out of proportion when compared to the CT images and the clinical findings.  He did have a prior history of chronic pain issues.  He seems to be on multiple and multimodal therapy for pain and muscle issues including Flexeril , Tylenol , NSAIDS. Discussed with him in detail about the potential for open repair given his multiple abdominal operations.  Anticipate that his abdomen is going to be very hostile and an anterior approach will probably be in his best interest. That being said I am not in a rush.  Potentially we can tentatively try to do it tomorrow but I would like to interrogate with a drug screen to see if there is any other confounding factors. He does not have evidence of an acute incarceration or strangulation or bowel obstruction to mandate emergent surgical intervention at this time. Please note that I have done extensive review in care everywhere about prior hospitalization and prior doctor visits.  There is no clear-cut pattern of drug-seeking behavior. If I decide to move forward with surgery I will set limits on narcotics being prescribed.  Again not a straight forward clinical presentation. I personally spent a total of 75  minutes in the care of the patient today including performing a medically appropriate exam/evaluation, counseling and educating, placing orders, referring and communicating with other health care professionals, documenting clinical information in the EHR, independently interpreting and reviewing images studies and coordinating care.    Laneta Luna, MD FACS General Surgeon 10/09/2023, 11:46 AM

## 2023-10-09 NOTE — ED Notes (Signed)
MD aware pt requesting more pain medication

## 2023-10-09 NOTE — Progress Notes (Signed)
 Interim progress note not for billing.  I have reviewed the chart, seen and examined the patient, and agree with the assessment and plan unless stipulated otherwise.  Here with symptomatic right inguinal hernia. History hernia repair that groin some 25 years ago. Gen surg has seen, does not appear to have an incarcerated or strangulated bowel but given significant ongoing pain will plan on proceeding with operative repair tomorrow.

## 2023-10-09 NOTE — Consult Note (Signed)
 Patient ID: Jason Cantu., male   DOB: 04-17-1965, 58 y.o.   MRN: [id number]  HPI Jason Cantu. is a 58 y.o. male seen in consultation at the request of Dr. Kandis for a symptomatic inguinal hernia.  He reports that he started hurting Yesterday and the pain is moderate to severe intensity sharp and located in the right inguinal region.  No specific alleviating or aggravating factors.  Per ER report from the he had an incarcerated hernia that ER physician was able to reduce.  He actually had to call EMS and he was given 100 mcg of fentanyl    Prior penetrating trauma to his abdominal cavity requiring exploratory laparotomy and major ventral hernia repair at some point.  He also had a prior history of a right inguinal hernia repair does not recall whether or not they used mesh. He did have history of chronic pain and was at some point in time and Suboxone. CT scan pers reviewed showing RIH w/o strangulation or incarceration. No other acute findings. CBC  wbx 12.9, nml hb and nml bmp   HPI  Past Medical History:  Diagnosis Date   Arthritis    COPD (chronic obstructive pulmonary disease) (HCC) 08/22/2015   DJD (degenerative joint disease) 08/22/2015   Fibromyalgia 08/22/2015   H/O degenerative disc disease    Lupus (systemic lupus erythematosus) (HCC)    Osteoarthritis 08/22/2015   Pain medication agreement signed 09/05/2015   Overview:  Patient signed pain contract with [facility] on September 05, 2015 and is only to receive opiate medications from [facility].   Raynaud's phenomenon 08/22/2015   SLE (systemic lupus erythematosus) (HCC) 08/22/2015    Past Surgical History:  Procedure Laterality Date   ABDOMINAL SURGERY     COLONOSCOPY WITH PROPOFOL  N/A 03/13/2016   Procedure: COLONOSCOPY WITH PROPOFOL ;  Surgeon: Ruel Kung, MD;  Location: [facility] ENDOSCOPY;  Service: Endoscopy;  Laterality: N/A;   COLONOSCOPY WITH PROPOFOL  N/A 07/31/2016   Procedure: COLONOSCOPY WITH PROPOFOL ;   Surgeon: Ruel Kung, MD;  Location: [facility] END[facility];  Service: Endoscopy;  Laterality: N/A;   ESOPHAGOGASTRODUODENOSCOPY (EGD) WITH PROPOFOL  N/A 03/13/2016   Procedure: ESOPHAGOGASTRODUODENOSCOPY (EGD) WITH PROPOFOL ;  Surgeon: Ruel Kung, MD;  Location: [facility] END[facility];  Service: Endoscopy;  Laterality: N/A;   HERNIA REPAIR      Family History  Problem Relation Age of Onset   Heart disease Mother    Hyperlipidemia Mother    Hypertension Mother    Heart disease Father    Hyperlipidemia Father    Hypertension Father    Heart disease Brother    Hyperlipidemia Brother    Diabetes Brother     Social History Social History   Tobacco Use   Smoking status: Every Day    Current packs/day: 1.00    Types: Cigarettes   Smokeless tobacco: Never  Substance Use Topics   Alcohol use: Yes    Comment: occasional consumption   Drug use: No    Allergies  Allergen Reactions   Codeine Nausea Only   Nortriptyline Other (See Comments)    Other reaction(s): Other (See Comments) Caused body to jerk all over per pt.   Trazodone And Nefazodone    Hydrocodone Nausea And Vomiting and Other (See Comments)    Current Facility-Administered Medications  Medication Dose Route Frequency Provider Last Rate Last Admin   0.9 %  sodium chloride  infusion   Intravenous Continuous Acheampong, Peter K, MD 75 mL/hr at 10/09/23 1135 New Bag at 10/09/23 1135  acetaminophen  (TYLENOL ) tablet 1,000 mg  1,000 mg Oral Q6H PRN Acheampong, Peter K, MD       albuterol  (PROVENTIL ) (2.5 MG/3ML) 0.083% nebulizer solution 2.5 mg  2.5 mg Nebulization Q2H PRN Acheampong, Peter K, MD       aspirin  EC tablet 81 mg  81 mg Oral Daily Acheampong, Peter K, MD   81 mg at 10/09/23 1129   bisacodyl  (DULCOLAX) EC tablet 5 mg  5 mg Oral Daily PRN Acheampong, Peter K, MD       busPIRone  (BUSPAR ) tablet 10 mg  10 mg Oral BID Acheampong, Peter K, MD   10 mg at 10/09/23 1129   docusate sodium  (COLACE) capsule 100 mg  100 mg Oral BID  Acheampong, Peter K, MD   100 mg at 10/09/23 1129   enoxaparin  (LOVENOX ) injection 40 mg  40 mg Subcutaneous Q24H Acheampong, Peter K, MD   40 mg at 10/09/23 9148   gabapentin  (NEURONTIN ) capsule 300 mg  300 mg Oral TID Acheampong, Peter K, MD   300 mg at 10/09/23 1129   hydrALAZINE  (APRESOLINE ) injection 5 mg  5 mg Intravenous Q6H PRN Acheampong, Peter K, MD       HYDROmorphone  (DILAUDID ) injection 1 mg  1 mg Intravenous Q4H PRN Acheampong, Peter K, MD   1 mg at 10/09/23 9148   hydroxychloroquine  (PLAQUENIL ) tablet 200 mg  200 mg Oral BID Acheampong, Peter K, MD   200 mg at 10/09/23 1129   ketorolac  (TORADOL ) 15 MG/ML injection 15 mg  15 mg Intravenous Once Acheampong, Peter K, MD       ondansetron  (ZOFRAN ) tablet 4 mg  4 mg Oral Q6H PRN Acheampong, Peter K, MD       Or   ondansetron  (ZOFRAN ) injection 4 mg  4 mg Intravenous Q6H PRN Acheampong, Peter K, MD   4 mg at 10/09/23 0450   pantoprazole  (PROTONIX ) EC tablet 20 mg  20 mg Oral Daily Acheampong, Peter K, MD   20 mg at 10/09/23 1128     Review of Systems Full ROS  was asked and was negative except for the information on the HPI  Physical Exam Blood pressure 101/66, pulse 85, temperature 97.9 F (36.6 C), temperature source Oral, resp. rate 18, height 5' 6 (1.676 m), weight 63.5 kg, SpO2 100%. CONSTITUTIONAL: NAD. EYES: Pupils are equal, round, Sclera are non-icteric. EARS, NOSE, MOUTH AND THROAT: The oropharynx is clear. The oral mucosa is pink and moist. Hearing is intact to voice. LYMPH NODES:  Lymph nodes in the neck are normal. RESPIRATORY:  Lungs are clear. There is normal respiratory effort, with equal breath sounds bilaterally, and without pathologic use of accessory muscles. CARDIOVASCULAR: Heart is regular without murmurs, gallops, or rubs. GI: The abdomen is  soft, there is a hernia in the right inguinal area that is reducible.  When I distracted the patient he was not in too much pain but as soon as he concentrated back on  the exam he exhibited significant pain in his right inguinal region.  There are no palpable masses. There is no hepatosplenomegaly. There are normal bowel sounds He does have a major midline laparotomy scar GU: Rectal deferred.   MUSCULOSKELETAL: Normal muscle strength and tone. No cyanosis or edema.   SKIN: Turgor is good and there are no pathologic skin lesions or ulcers. NEUROLOGIC: Motor and sensation is grossly normal. Cranial nerves are grossly intact. PSYCH:  Oriented to person, place and time. Affect is normal.  Data Reviewed I have  personally reviewed the patient's imaging, laboratory findings and medical records.    Assessment/Plan 58 year old male with a symptomatic right inguinal hernia.  It is interesting to note that his symptoms seems to be out of proportion when compared to the CT images and the clinical findings.  He did have a prior history of chronic pain issues.  He seems to be on multiple and multimodal therapy for pain and muscle issues including Flexeril , Tylenol , NSAIDS. Discussed with him in detail about the potential for open repair given his multiple abdominal operations.  Anticipate that his abdomen is going to be very hostile and an anterior approach will probably be in his best interest. That being said I am not in a rush.  Potentially we can tentatively try to do it tomorrow but I would like to interrogate with a drug screen to see if there is any other confounding factors. He does not have evidence of an acute incarceration or strangulation or bowel obstruction to mandate emergent surgical intervention at this time. Please note that I have done extensive review in care everywhere about prior hospitalization and prior doctor visits.  There is no clear-cut pattern of drug-seeking behavior. If I decide to move forward with surgery I will set limits on narcotics being prescribed.  Again not a straight forward clinical presentation. I personally spent a total of 75  minutes in the care of the patient today including performing a medically appropriate exam/evaluation, counseling and educating, placing orders, referring and communicating with other health care professionals, documenting clinical information in the EHR, independently interpreting and reviewing images studies and coordinating care.    Laneta Luna, MD FACS General Surgeon 10/09/2023, 11:46 AM

## 2023-10-09 NOTE — ED Triage Notes (Signed)
 Pt presented to ED BIBA from home with c/o right sided inguinal hernia pain starting approx 1 hour PTA. States pain 10/10. States 100mcg fentanyl  and 4mg  zofran  given by EMS PTA. States fentanyl  did not help with pain.

## 2023-10-10 ENCOUNTER — Encounter
Admission: EM | Disposition: A | Discharge status: Home / Self Care | Payer: Self-pay | Source: Home / Self Care | Attending: Obstetrics and Gynecology

## 2023-10-10 ENCOUNTER — Encounter: Payer: Self-pay | Admitting: Internal Medicine

## 2023-10-10 ENCOUNTER — Observation Stay: Admitting: Anesthesiology

## 2023-10-10 ENCOUNTER — Other Ambulatory Visit: Payer: Self-pay

## 2023-10-10 DIAGNOSIS — M1990 Unspecified osteoarthritis, unspecified site: Secondary | ICD-10-CM | POA: Diagnosis present

## 2023-10-10 DIAGNOSIS — R109 Unspecified abdominal pain: Secondary | ICD-10-CM | POA: Diagnosis present

## 2023-10-10 DIAGNOSIS — D72829 Elevated white blood cell count, unspecified: Secondary | ICD-10-CM | POA: Diagnosis present

## 2023-10-10 DIAGNOSIS — Z833 Family history of diabetes mellitus: Secondary | ICD-10-CM | POA: Diagnosis not present

## 2023-10-10 DIAGNOSIS — Z791 Long term (current) use of non-steroidal anti-inflammatories (NSAID): Secondary | ICD-10-CM | POA: Diagnosis not present

## 2023-10-10 DIAGNOSIS — K4090 Unilateral inguinal hernia, without obstruction or gangrene, not specified as recurrent: Secondary | ICD-10-CM | POA: Diagnosis not present

## 2023-10-10 DIAGNOSIS — N200 Calculus of kidney: Secondary | ICD-10-CM | POA: Diagnosis present

## 2023-10-10 DIAGNOSIS — F17210 Nicotine dependence, cigarettes, uncomplicated: Secondary | ICD-10-CM | POA: Diagnosis present

## 2023-10-10 DIAGNOSIS — I7300 Raynaud's syndrome without gangrene: Secondary | ICD-10-CM | POA: Diagnosis present

## 2023-10-10 DIAGNOSIS — Z79899 Other long term (current) drug therapy: Secondary | ICD-10-CM | POA: Diagnosis not present

## 2023-10-10 DIAGNOSIS — J449 Chronic obstructive pulmonary disease, unspecified: Secondary | ICD-10-CM | POA: Diagnosis present

## 2023-10-10 DIAGNOSIS — Z7982 Long term (current) use of aspirin: Secondary | ICD-10-CM | POA: Diagnosis not present

## 2023-10-10 DIAGNOSIS — K4091 Unilateral inguinal hernia, without obstruction or gangrene, recurrent: Secondary | ICD-10-CM | POA: Diagnosis present

## 2023-10-10 DIAGNOSIS — M797 Fibromyalgia: Secondary | ICD-10-CM | POA: Diagnosis present

## 2023-10-10 DIAGNOSIS — E872 Acidosis, unspecified: Secondary | ICD-10-CM | POA: Diagnosis present

## 2023-10-10 DIAGNOSIS — G894 Chronic pain syndrome: Secondary | ICD-10-CM | POA: Diagnosis present

## 2023-10-10 DIAGNOSIS — K8020 Calculus of gallbladder without cholecystitis without obstruction: Secondary | ICD-10-CM | POA: Diagnosis present

## 2023-10-10 DIAGNOSIS — Z885 Allergy status to narcotic agent status: Secondary | ICD-10-CM | POA: Diagnosis not present

## 2023-10-10 DIAGNOSIS — X500XXA Overexertion from strenuous movement or load, initial encounter: Secondary | ICD-10-CM | POA: Diagnosis not present

## 2023-10-10 DIAGNOSIS — K4031 Unilateral inguinal hernia, with obstruction, without gangrene, recurrent: Secondary | ICD-10-CM | POA: Diagnosis not present

## 2023-10-10 DIAGNOSIS — E876 Hypokalemia: Secondary | ICD-10-CM | POA: Diagnosis present

## 2023-10-10 DIAGNOSIS — R911 Solitary pulmonary nodule: Secondary | ICD-10-CM | POA: Diagnosis present

## 2023-10-10 DIAGNOSIS — Z888 Allergy status to other drugs, medicaments and biological substances status: Secondary | ICD-10-CM | POA: Diagnosis not present

## 2023-10-10 DIAGNOSIS — Z8249 Family history of ischemic heart disease and other diseases of the circulatory system: Secondary | ICD-10-CM | POA: Diagnosis not present

## 2023-10-10 HISTORY — PX: INGUINAL HERNIA REPAIR: SHX194

## 2023-10-10 SURGERY — REPAIR, HERNIA, INGUINAL, ADULT
Anesthesia: General | Site: Abdomen | Laterality: Right

## 2023-10-10 MED ORDER — FENTANYL CITRATE (PF) 100 MCG/2ML IJ SOLN
INTRAMUSCULAR | Status: AC
  Filled 2023-10-10: qty 2

## 2023-10-10 MED ORDER — OXYCODONE HCL 5 MG PO TABS
ORAL_TABLET | ORAL | Status: AC
Start: 2023-10-10 — End: 2023-10-10
  Filled 2023-10-10: qty 1

## 2023-10-10 MED ORDER — BUPIVACAINE-EPINEPHRINE (PF) 0.25% -1:200000 IJ SOLN
INTRAMUSCULAR | Status: DC | PRN
  Administered 2023-10-10: 30 mL via PERINEURAL

## 2023-10-10 MED ORDER — MIDAZOLAM HCL 2 MG/2ML IJ SOLN
INTRAMUSCULAR | Status: AC
  Filled 2023-10-10: qty 2

## 2023-10-10 MED ORDER — QUETIAPINE FUMARATE 25 MG PO TABS: 100.0000 mg | ORAL_TABLET | Freq: Once | ORAL | Status: DC

## 2023-10-10 MED ORDER — KETAMINE HCL 50 MG/5ML IJ SOSY
50.0000 mg | PREFILLED_SYRINGE | Freq: Once | INTRAMUSCULAR | Status: AC
  Administered 2023-10-10: 50 mg via INTRAVENOUS

## 2023-10-10 MED ORDER — QUETIAPINE FUMARATE 25 MG PO TABS
100.0000 mg | ORAL_TABLET | Freq: Once | ORAL | Status: AC
  Administered 2023-10-10: 100 mg via ORAL
  Filled 2023-10-10: qty 4

## 2023-10-10 MED ORDER — ONDANSETRON HCL 4 MG/2ML IJ SOLN
INTRAMUSCULAR | Status: AC
Start: 2023-10-10 — End: 2023-10-10
  Filled 2023-10-10: qty 2

## 2023-10-10 MED ORDER — KETAMINE HCL 50 MG/5ML IJ SOSY
PREFILLED_SYRINGE | INTRAMUSCULAR | Status: AC
  Filled 2023-10-10: qty 5

## 2023-10-10 MED ORDER — MIDAZOLAM HCL 2 MG/2ML IJ SOLN
INTRAMUSCULAR | Status: DC | PRN
  Administered 2023-10-10: 2 mg via INTRAVENOUS

## 2023-10-10 MED ORDER — OXYCODONE HCL 5 MG PO TABS
5.0000 mg | ORAL_TABLET | Freq: Once | ORAL | Status: AC | PRN
  Administered 2023-10-10: 5 mg via ORAL

## 2023-10-10 MED ORDER — DEXMEDETOMIDINE HCL IN NACL 80 MCG/20ML IV SOLN
INTRAVENOUS | Status: DC | PRN
  Administered 2023-10-10 (×2): 8 ug via INTRAVENOUS
  Administered 2023-10-10: 12 ug via INTRAVENOUS

## 2023-10-10 MED ORDER — ACETAMINOPHEN 10 MG/ML IV SOLN
INTRAVENOUS | Status: AC
  Filled 2023-10-10: qty 100

## 2023-10-10 MED ORDER — DEXMEDETOMIDINE HCL IN NACL 80 MCG/20ML IV SOLN
INTRAVENOUS | Status: AC
  Filled 2023-10-10: qty 20

## 2023-10-10 MED ORDER — CEFAZOLIN SODIUM-DEXTROSE 2-4 GM/100ML-% IV SOLN
2.0000 g | Freq: Two times a day (BID) | INTRAVENOUS | Status: AC
  Administered 2023-10-10 – 2023-10-11 (×2): 2 g via INTRAVENOUS
  Filled 2023-10-10 (×2): qty 100

## 2023-10-10 MED ORDER — ONDANSETRON HCL 4 MG/2ML IJ SOLN
INTRAMUSCULAR | Status: DC | PRN
Start: 2023-10-10 — End: 2023-10-10
  Administered 2023-10-10: 4 mg via INTRAVENOUS

## 2023-10-10 MED ORDER — LACTATED RINGERS IV SOLN: INTRAVENOUS | Status: DC

## 2023-10-10 MED ORDER — FENTANYL CITRATE (PF) 100 MCG/2ML IJ SOLN
INTRAMUSCULAR | Status: DC | PRN
  Administered 2023-10-10 (×4): 50 ug via INTRAVENOUS

## 2023-10-10 MED ORDER — GABAPENTIN 400 MG PO CAPS
400.0000 mg | ORAL_CAPSULE | Freq: Three times a day (TID) | ORAL | Status: DC
  Administered 2023-10-10 – 2023-10-11 (×3): 400 mg via ORAL
  Filled 2023-10-10 (×3): qty 1

## 2023-10-10 MED ORDER — OXYCODONE HCL 5 MG/5ML PO SOLN: 5.0000 mg | Freq: Once | ORAL | Status: AC | PRN

## 2023-10-10 MED ORDER — ONDANSETRON HCL 4 MG/2ML IJ SOLN
INTRAMUSCULAR | Status: AC
  Filled 2023-10-10: qty 2

## 2023-10-10 MED ORDER — BUPIVACAINE LIPOSOME 1.3 % IJ SUSP
INTRAMUSCULAR | Status: DC | PRN
  Administered 2023-10-10: 20 mL

## 2023-10-10 MED ORDER — QUETIAPINE FUMARATE 25 MG PO TABS
200.0000 mg | ORAL_TABLET | Freq: Every day | ORAL | Status: DC
  Administered 2023-10-10: 200 mg via ORAL
  Filled 2023-10-10: qty 8

## 2023-10-10 MED ORDER — PROPOFOL 10 MG/ML IV BOLUS
INTRAVENOUS | Status: DC | PRN
  Administered 2023-10-10: 150 mg via INTRAVENOUS

## 2023-10-10 MED ORDER — KETAMINE HCL 50 MG/5ML IJ SOSY
PREFILLED_SYRINGE | INTRAMUSCULAR | Status: DC | PRN
  Administered 2023-10-10: 50 mg via INTRAVENOUS

## 2023-10-10 MED ORDER — DEXAMETHASONE SODIUM PHOSPHATE 10 MG/ML IJ SOLN
INTRAMUSCULAR | Status: AC
  Filled 2023-10-10: qty 1

## 2023-10-10 MED ORDER — ROCURONIUM BROMIDE 100 MG/10ML IV SOLN
INTRAVENOUS | Status: DC | PRN
  Administered 2023-10-10: 10 mg via INTRAVENOUS
  Administered 2023-10-10: 30 mg via INTRAVENOUS

## 2023-10-10 MED ORDER — ACETAMINOPHEN 10 MG/ML IV SOLN
INTRAVENOUS | Status: DC | PRN
  Administered 2023-10-10: 1000 mg via INTRAVENOUS

## 2023-10-10 MED ORDER — ROCURONIUM BROMIDE 10 MG/ML (PF) SYRINGE
PREFILLED_SYRINGE | INTRAVENOUS | Status: AC
  Filled 2023-10-10: qty 10

## 2023-10-10 MED ORDER — LIDOCAINE HCL (PF) 2 % IJ SOLN
INTRAMUSCULAR | Status: AC
  Filled 2023-10-10: qty 5

## 2023-10-10 MED ORDER — FENTANYL CITRATE (PF) 100 MCG/2ML IJ SOLN
25.0000 ug | INTRAMUSCULAR | Status: DC | PRN
  Administered 2023-10-10 (×2): 50 ug via INTRAVENOUS

## 2023-10-10 MED ORDER — HYDROMORPHONE HCL 2 MG PO TABS
2.0000 mg | ORAL_TABLET | ORAL | Status: DC | PRN
  Administered 2023-10-10 – 2023-10-11 (×4): 2 mg via ORAL
  Filled 2023-10-10 (×5): qty 1

## 2023-10-10 MED ORDER — OXYCODONE HCL 5 MG PO TABS: 5.0000 mg | ORAL_TABLET | Freq: Four times a day (QID) | ORAL | Status: DC | PRN

## 2023-10-10 MED ORDER — 0.9 % SODIUM CHLORIDE (POUR BTL) OPTIME
TOPICAL | Status: DC | PRN
  Administered 2023-10-10: 1000 mL

## 2023-10-10 MED ORDER — ACETAMINOPHEN 500 MG PO TABS
1000.0000 mg | ORAL_TABLET | Freq: Four times a day (QID) | ORAL | Status: DC
  Administered 2023-10-10 – 2023-10-11 (×4): 1000 mg via ORAL
  Filled 2023-10-10 (×4): qty 2

## 2023-10-10 MED ORDER — SUGAMMADEX SODIUM 200 MG/2ML IV SOLN
INTRAVENOUS | Status: DC | PRN
Start: 2023-10-10 — End: 2023-10-10
  Administered 2023-10-10: 200 mg via INTRAVENOUS

## 2023-10-10 MED ORDER — KETOROLAC TROMETHAMINE 30 MG/ML IJ SOLN
30.0000 mg | Freq: Four times a day (QID) | INTRAMUSCULAR | Status: DC
  Administered 2023-10-10 – 2023-10-11 (×3): 30 mg via INTRAVENOUS
  Filled 2023-10-10 (×3): qty 1

## 2023-10-10 MED ORDER — CYCLOBENZAPRINE HCL 10 MG PO TABS
5.0000 mg | ORAL_TABLET | Freq: Three times a day (TID) | ORAL | Status: DC
  Administered 2023-10-10 – 2023-10-11 (×3): 5 mg via ORAL
  Filled 2023-10-10 (×3): qty 1

## 2023-10-10 MED ORDER — LACTATED RINGERS IV SOLN: INTRAVENOUS | Status: DC | PRN

## 2023-10-10 MED ORDER — DEXAMETHASONE SODIUM PHOSPHATE 10 MG/ML IJ SOLN
INTRAMUSCULAR | Status: DC | PRN
  Administered 2023-10-10: 10 mg via INTRAVENOUS

## 2023-10-10 MED ORDER — POLYETHYLENE GLYCOL 3350 17 G PO PACK
17.0000 g | PACK | Freq: Every day | ORAL | Status: DC
  Filled 2023-10-10 (×2): qty 1

## 2023-10-10 MED ORDER — LIDOCAINE HCL (CARDIAC) PF 100 MG/5ML IV SOSY
PREFILLED_SYRINGE | INTRAVENOUS | Status: DC | PRN
  Administered 2023-10-10: 60 mg via INTRAVENOUS

## 2023-10-10 MED ORDER — KETOROLAC TROMETHAMINE 30 MG/ML IJ SOLN
INTRAMUSCULAR | Status: DC | PRN
  Administered 2023-10-10: 30 mg via INTRAVENOUS

## 2023-10-10 MED ORDER — ACETAMINOPHEN 10 MG/ML IV SOLN: 1000.0000 mg | Freq: Once | INTRAVENOUS | Status: DC | PRN

## 2023-10-10 MED ORDER — CHLORHEXIDINE GLUCONATE CLOTH 2 % EX PADS: 6.0000 | MEDICATED_PAD | Freq: Every day | CUTANEOUS | Status: DC

## 2023-10-10 MED ORDER — PROPOFOL 10 MG/ML IV BOLUS
INTRAVENOUS | Status: AC
  Filled 2023-10-10: qty 20

## 2023-10-10 SURGICAL SUPPLY — 27 items
BLADE CLIPPER SURG (BLADE) IMPLANT
CHLORAPREP W/TINT 26 (MISCELLANEOUS) IMPLANT
DERMABOND ADVANCED .7 DNX12 (GAUZE/BANDAGES/DRESSINGS) ×2 IMPLANT
DERMABOND ADVANCED .7 DNX6 (GAUZE/BANDAGES/DRESSINGS) IMPLANT
DRAIN PENROSE 0.625X18 (DRAIN) ×2 IMPLANT
DRAPE INCISE IOBAN 66X45 STRL (DRAPES) ×2 IMPLANT
DRAPE LAPAROTOMY 77X122 PED (DRAPES) ×2 IMPLANT
ELECTRODE REM PT RTRN 9FT ADLT (ELECTROSURGICAL) ×2 IMPLANT
GLOVE BIO SURGEON STRL SZ7 (GLOVE) ×2 IMPLANT
GOWN STRL REUS W/ TWL LRG LVL3 (GOWN DISPOSABLE) ×4 IMPLANT
MANIFOLD NEPTUNE II (INSTRUMENTS) ×2 IMPLANT
MESH SYNTHETIC 1.8X4 KEYHOLE S (Mesh General) IMPLANT
NDL HYPO 22X1.5 SAFETY MO (MISCELLANEOUS) ×2 IMPLANT
NEEDLE HYPO 22X1.5 SAFETY MO (MISCELLANEOUS) ×1 IMPLANT
NS IRRIG 1000ML POUR BTL (IV SOLUTION) ×2 IMPLANT
PACK BASIN MINOR ARMC (MISCELLANEOUS) ×2 IMPLANT
SPONGE KITTNER 5P (MISCELLANEOUS) ×2 IMPLANT
SPONGE T-LAP 18X18 ~~LOC~~+RFID (SPONGE) ×2 IMPLANT
SUT ETHIBOND NAB MO 7 #0 18IN (SUTURE) ×2 IMPLANT
SUT MNCRL AB 4-0 PS2 18 (SUTURE) ×2 IMPLANT
SUT SILK 2 0SH CR/8 30 (SUTURE) IMPLANT
SUT SILK 2-0 18XBRD TIE 12 (SUTURE) IMPLANT
SUT VIC AB 3-0 54X BRD REEL (SUTURE) ×2 IMPLANT
SUT VIC AB 3-0 SH 27X BRD (SUTURE) ×2 IMPLANT
SYR 20ML LL LF (SYRINGE) ×2 IMPLANT
TRAP FLUID SMOKE EVACUATOR (MISCELLANEOUS) ×2 IMPLANT
WATER STERILE IRR 500ML POUR (IV SOLUTION) ×2 IMPLANT

## 2023-10-10 NOTE — Op Note (Signed)
 Open Recurrent Right Inguinal Hernia Repair with Mesh      Pre-operative Diagnosis:  recurrent Right Inguinal Hernia   Post-operative Diagnosis: Same   Surgeon: Laneta Luna, MD FACS   Anesthesia: Gen. with endotracheal tube   Findings: Right  inguinal hernia with medial recurrence, significant adhesion and distorted anatomy due to prior repair        Procedure Details  The patient was seen again in the Holding Room. The benefits, complications, treatment options, and expected outcomes were discussed with the patient. The risks of bleeding, infection, recurrence of symptoms, failure to resolve symptoms, recurrence of hernia, ischemic orchitis, chronic pain syndrome or neuroma, were discussed again. The likelihood of improving the patient's symptoms with return to their baseline status is good.  The patient and/or family concurred with the proposed plan, giving informed consent.  The patient was taken to Operating Room, identified  and the procedure verified as  Inguinal Hernia Repair. Laterality confirmed.  A Time Out was held and the above information confirmed.   Prior to the induction of general anesthesia, antibiotic prophylaxis was administered. VTE prophylaxis was in place. General endotracheal anesthesia was then administered and tolerated well. After the induction, the abdomen was prepped with Chloraprep and draped in the sterile fashion. The patient was positioned in the supine position.     Right inguinal incision was created with a 15 blade knife and electrocautery was used to dissect through the subcutaneous tissue.  The external bleak was dissected and incised.  Please note that the whole floor as well as the roof of the inguinal canal was distorted and very attenuated.  The external oblique was very thin and we incised all the way to the external inguinal ring.  The ilioinguinal nerve was identified and cauterized given that this giant hernias will have a lot of neuromas and  postoperative pain.  I did not want to create more pain . We were able to encircle the cord structures in the standard fashion.  Once again his anatomy had significant distortion and the tissues were very frail and thin.  I was able to finally restore the  anatomy and identified a medium to small recurrent hernia. I was Able to reduce the sac into the abdominal cavity.  Please note that the hernia was chronically incarcerated. In order to reinforce the floor of the inguinal canal I was able to approximate the conjoined tendon to the shelving edge of the inguinal ligament.I used BARD preshape 4.5 x 10 cms prolypropelene Mesh to perform Lichtenstein repair by fixing the mesh to the shelving edge of the inguinal ligament laterally and to the conjoined tendon medially.  Also make sure that the cord structures were protected at all times as well as the vas deferens. I was able to close the external oblique fascia with 2-0 Vicryl in a running fashion.  Using Exparel  a field block was performed under direct visualization.  The subcutaneous tissue was approximated using 3-0 Vicryl 4-0 subcuticular Monocryl was used at all skin edges. Dermabond was placed.  Patient tolerated the procedure well. There were no complications. He was taken to the recovery room in stable condition.

## 2023-10-10 NOTE — Progress Notes (Signed)
 PROGRESS NOTE    Jason Cantu Jason Cantu.  FMW:[id number] DOB: Jun 03, 1965 DOA: 10/09/2023 PCP: Reino Anes, MD  Outpatient Specialists: none    Brief Narrative:   Here with symptomatic right inguinal hernia. History hernia repair that groin some 25 years ago. Gen surg has seen, does not appear to have an incarcerated or strangulated bowel but given significant ongoing pain will plan on proceeding with operative repair on 7/6 which has now been performed   Assessment & Plan:   Principal Problem:   Inguinal hernia, right Active Problems:   COPD (chronic obstructive pulmonary disease) (HCC)   SLE (systemic lupus erythematosus) (HCC)   Abdominal pain   # Symptomatic right inguinal hernia Repaired when he was in his 16s, now symptomatic, operative repair performed today (medium to small recurrent hernia with chronic incarceration). Patient doesn't feel well enough to go home so agreed to keep him today, advised will need to plan on d/c tomorrow unless there is objective reason to stay - advance diet - pain control, bowel regimen  # Chronic pain Contributes to above symptomatology  # Lactic acidosis Resolved  # SLE? Recently saw rheum, labs negative, they have documented they think patient may not have lupus - will hold plaquenil  during recovery from this surgery  # Pulmonary nodule - outpt surveillance   DVT prophylaxis: lovenox  Code Status: full Family Communication: none at bedside  Level of care: Med-Surg Status is: Observation    Consultants:  Gen surg  Procedures: Open inguinal hernia repair 7/6  Antimicrobials:  Peri-operative    Subjective: Reports stable pain right groin after surgery. Minimal appetite, no nausea/vomiting  Objective: Vitals:   10/10/23 1030 10/10/23 1045 10/10/23 1100 10/10/23 1117  BP: 129/75 135/73 131/77 125/74  Pulse: 73 81 73 77  Resp: 20 14 12 17   Temp:  97.9 F (36.6 C)  (!) 97.5 F (36.4 C)  TempSrc:    Oral  SpO2:  100% 98% 99% 99%  Weight:      Height:        Intake/Output Summary (Last 24 hours) at 10/10/2023 1444 Last data filed at 10/10/2023 1017 Gross per 24 hour  Intake 3758.83 ml  Output 5 ml  Net 3753.83 ml   Filed Weights   10/09/23 0032  Weight: 63.5 kg    Examination:  General exam: Appears calm and comfortable  Respiratory system: Clear to auscultation. Respiratory effort normal. Cardiovascular system: S1 & S2 heard, RRR.   Gastrointestinal system: c/d/I bandage over right groin, abdomen soft Central nervous system: Alert and oriented. No focal neurological deficits. Extremities: Symmetric 5 x 5 power. Skin: No rashes, lesions or ulcers Psychiatry: Judgement and insight appear normal. Mood & affect appropriate.     Data Reviewed: I have personally reviewed following labs and imaging studies  CBC: Recent Labs  Lab 10/09/23 0046 10/09/23 0447  WBC 16.6* 12.9*  NEUTROABS 13.2*  --   HGB 11.8* 12.4*  HCT 34.9* 36.9*  MCV 93.3 93.9  PLT 320 301   Basic Metabolic Panel: Recent Labs  Lab 10/09/23 0046 10/09/23 0447  NA 136 136  K 3.0* 3.4*  CL 103 102  CO2 21* 23  GLUCOSE 114* 121*  BUN 9 7  CREATININE 0.83 0.76  CALCIUM 9.8 9.3   GFR: Estimated Creatinine Clearance: 92.6 mL/min (by C-G formula based on SCr of 0.76 mg/dL). Liver Function Tests: Recent Labs  Lab 10/09/23 0046  AST 25  ALT 14  ALKPHOS 64  BILITOT 0.6  PROT 7.0  ALBUMIN 4.2   No results for input(s): LIPASE, AMYLASE in the last 168 hours. No results for input(s): AMMONIA in the last 168 hours. Coagulation Profile: No results for input(s): INR, PROTIME in the last 168 hours. Cardiac Enzymes: No results for input(s): CKTOTAL, CKMB, CKMBINDEX, TROPONINI in the last 168 hours. BNP (last 3 results) No results for input(s): PROBNP in the last 8760 hours. HbA1C: No results for input(s): HGBA1C in the last 72 hours. CBG: No results for input(s): GLUCAP in the last  168 hours. Lipid Profile: No results for input(s): CHOL, HDL, LDLCALC, TRIG, CHOLHDL, LDLDIRECT in the last 72 hours. Thyroid Function Tests: No results for input(s): TSH, T4TOTAL, FREET4, T3FREE, THYROIDAB in the last 72 hours. Anemia Panel: No results for input(s): VITAMINB12, FOLATE, FERRITIN, TIBC, IRON, RETICCTPCT in the last 72 hours. Urine analysis:    Component Value Date/Time   COLORURINE YELLOW (A) 10/09/2023 0857   APPEARANCEUR CLEAR (A) 10/09/2023 0857   LABSPEC 1.033 (H) 10/09/2023 0857   PHURINE 8.0 10/09/2023 0857   GLUCOSEU NEGATIVE 10/09/2023 0857   HGBUR NEGATIVE 10/09/2023 0857   BILIRUBINUR NEGATIVE 10/09/2023 0857   KETONESUR 5 (A) 10/09/2023 0857   PROTEINUR NEGATIVE 10/09/2023 0857   NITRITE NEGATIVE 10/09/2023 0857   LEUKOCYTESUR NEGATIVE 10/09/2023 0857   Sepsis Labs: @LABRCNTIP (procalcitonin:4,lacticidven:4)  )No results found for this or any previous visit (from the past 240 hours).       Radiology Studies: DG Abd 1 View Result Date: 10/09/2023 CLINICAL DATA:  Abdominal pain EXAM: ABDOMEN - 1 VIEW COMPARISON:  CT from earlier today FINDINGS: Bilateral renal calculi are again noted. The largest is in the inferior pole collecting system of the left kidney measuring 9 mm. The bowel gas pattern is nonobstructed. No dilated large or small bowel loops. Moderate retained stool in the right colon. Lumbar spondylosis. Contrast material from earlier CT is identified within the bladder IMPRESSION: 1. Nonobstructive bowel gas pattern. 2. Bilateral renal calculi. Electronically Signed   By: Waddell Calk M.D.   On: 10/09/2023 13:15   CT ABDOMEN PELVIS W CONTRAST Result Date: 10/09/2023 CLINICAL DATA:  Bowel obstruction suspected. Right sided inguinal hernia pain starting approx 1 hour PTA. States pain 10/10. EXAM: CT ABDOMEN AND PELVIS WITH CONTRAST TECHNIQUE: Multidetector CT imaging of the abdomen and pelvis was performed using the  standard protocol following bolus administration of intravenous contrast. RADIATION DOSE REDUCTION: This exam was performed according to the departmental dose-optimization program which includes automated exposure control, adjustment of the mA and/or kV according to patient size and/or use of iterative reconstruction technique. CONTRAST:  OMNIPAQUE  IOHEXOL  300 MG/ML  SOLN COMPARISON:  CT abdomen pelvis 05/04/2016. CT abdomen pelvis 01/27/2016 FINDINGS: Lower chest: Partially visualized right middle lobe 6 mm pulmonary nodule. Hepatobiliary: No focal liver abnormality. Calcified gallstone noted within the gallbladder lumen. No gallbladder wall thickening or pericholecystic fluid. No biliary dilatation. Pancreas: No focal lesion. Normal pancreatic contour. No surrounding inflammatory changes. No main pancreatic ductal dilatation. Spleen: Normal in size without focal abnormality. Adrenals/Urinary Tract: No adrenal nodule bilaterally. Bilateral kidneys enhance symmetrically. No hydronephrosis. No hydroureter. Bilateral nephrolithiasis measuring up to 1.2 cm on the left and 1 cm on the right. The urinary bladder is unremarkable. On delayed imaging, there is no urothelial wall thickening and there are no filling defects in the opacified portions of the bilateral collecting systems or ureters. Stomach/Bowel: Stomach is within normal limits. No evidence of bowel wall thickening or dilatation. Colonic diverticulosis. Appendix appears normal. Vascular/Lymphatic: No abdominal  aorta or iliac aneurysm. Moderate atherosclerotic plaque of the aorta and its branches. No abdominal, pelvic, or inguinal lymphadenopathy. Reproductive: Prostate is unremarkable. Other: No intraperitoneal free fluid. No intraperitoneal free gas. No organized fluid collection. Musculoskeletal: Small fat containing right inguinal hernia (5:30, 6:73). No suspicious lytic or blastic osseous lesions. No acute displaced fracture. Multilevel degenerative  changes of the spine. Mild digital is sizes of L1 on L2, L2 on L3, L3 on L4, L4 on L5, L5 on S1. IMPRESSION: 1. Small fat containing right inguinal hernia. Correlate clinically for incarceration. 2. Nonobstructive bilateral nephrolithiasis. 3. Cholelithiasis with no CT evidence of acute cholecystitis. 4. Colonic diverticulosis with no acute diverticulitis. 5. Right solid pulmonary nodule measuring 6 mm. Per Fleischner Society Guidelines, recommend a non-contrast Chest CT at 6-12 months. If patient is high risk for malignancy, consider an additional non-contrast Chest CT at 18-24 months. If patient is low risk for malignancy, non-contrast Chest CT at 18-24 months is optional. These guidelines do not apply to immunocompromised patients and patients with cancer. Follow up in patients with significant comorbidities as clinically warranted. For lung cancer screening, adhere to Lung-RADS guidelines. Reference: Radiology. 2017; 284(1):228-43. Electronically Signed   By: Morgane  Naveau M.D.   On: 10/09/2023 02:11        Scheduled Meds:  busPIRone   10 mg Oral BID   Chlorhexidine  Gluconate Cloth  6 each Topical Q0600   docusate sodium   100 mg Oral BID   enoxaparin  (LOVENOX ) injection  40 mg Subcutaneous Q24H   feeding supplement  1 Container Oral TID BM   gabapentin   300 mg Oral TID   ketorolac   15 mg Intravenous Once   pantoprazole   20 mg Oral Daily   polyethylene glycol  17 g Oral Daily   Continuous Infusions:   LOS: 0 days     Devaughn KATHEE Ban, MD [facility]   If 7PM-7AM, please contact night-coverage [email or web address] Password [email or web address] 10/10/2023, 2:44 PM

## 2023-10-10 NOTE — Discharge Instructions (Signed)
 In addition to included general post-operative instruction,  Diet: Resume home diet.   Activity: No heavy lifting >20 pounds (children, pets, laundry, garbage) or strenuous activity for 6 weeks, but light activity and walking are encouraged. Do not drive or drink alcohol if taking narcotic pain medications or having pain that might distract from driving.  Wound care: You may shower/get incision wet with soapy water and pat dry (do not rub incisions), but no baths or submerging incision underwater until follow-up.   Medications: Resume all home medications. For mild to moderate pain: acetaminophen  (Tylenol ) or ibuprofen/naproxen (if no kidney disease). Combining Tylenol  with alcohol can substantially increase your risk of causing liver disease. Narcotic pain medications, if prescribed, can be used for severe pain, though may cause nausea, constipation, and drowsiness. Do not combine Tylenol  and Percocet (or similar) within a 6 hour period as Percocet (and similar) contain(s) Tylenol . If you do not need the narcotic pain medication, you do not need to fill the prescription.  Call office [phone] / [phone]) at any time if any questions, worsening pain, fevers/chills, bleeding, drainage from incision site, or other concerns   ===============================================================    Groin Hernia (Inguinal Hernia) in Adults: What to Know  A hernia happens when an organ or tissue inside your body pushes out through a weak spot in the muscles of your belly. This makes a bulge. A groin hernia, or inguinal hernia, is found in the groin, which is the area where your leg meets your lower belly. If you're male, it may be in your scrotum. If you're male, it may be in the folds of skin around your vagina. There are three types: Reducible. This means the hernia can be pushed back into your belly. Incarcerated. This means it can't be pushed back into your belly. Strangulated. This means  it can't be pushed back into your belly and blood flow is cut off to the tissues inside it. If you get this type of hernia, you'll need surgery right away. What are the causes? A groin hernia may happen when you strain your lower belly muscles, such as when you: Lift a heavy object. Strain to poop. Cough. You can be born with a groin hernia, or it can form over time. What increases the risk? You're more likely to get a groin hernia if: You're male. You're pregnant or have been pregnant. You're 50 years or older. You've had a groin hernia or belly surgery before. You smoke. You're overweight. You work at a job where you need to stand a lot or lift heavy things. What are the signs or symptoms? Symptoms may depend on how big the hernia is. If it's small, you may not have symptoms. If it's bigger, you may have: A bulge near your groin or genitals. Pain or burning in your groin. A dull ache or feeling of pressure in your groin. If blood flow is cut off to the tissues inside the hernia, you may also: Feel pain and tenderness when you touch the bulge. The skin over it may turn red or purple. Have a fever. Throw up or feel like you may throw up. Have trouble pooping or passing gas. How is this diagnosed? You may be diagnosed based on your symptoms, medical history, and an exam. During the exam, you may be asked to cough or strain to force the bulge to stick out. Your health care provider may try to push the bulge back in. You may also need tests. These may include: An ultrasound.  An MRI. A CT scan. How is this treated? Treatment depends on how big the hernia is and what symptoms you have. You may need: To be watched to see if the bulge grows bigger. Surgery. This may be done if the hernia is big or if you have symptoms. Follow these instructions at home: Lifestyle Ask if it's OK for you to lift. Try not to stand for long periods of time. Do not smoke, vape, or use nicotine or  tobacco. Stay at a healthy weight. Try not to do things that put pressure on your hernia. Preventing trouble pooping You may need to take these steps to help prevent or treat trouble pooping (constipation): Take medicines to help you poop. Eat foods high in fiber, like beans, whole grains, and fresh fruits and vegetables. Drink more fluids as told. General instructions Try to push the hernia back in place by very gently pressing on it while lying down. Do not try to force it back in if it won't push in easily. Watch your hernia for any changes in: Shape. Size. Color. Take your medicines only as told. Contact a health care provider if: You have a fever. You have new symptoms. Your symptoms get worse. You can't poop or pass gas. Get help right away if: Your bulge: Starts to hurt a lot. Changes color. You have sudden pain in your scrotum, or the size of your scrotum changes all of a sudden. You can't gently push the hernia back in place. You keep throwing up or feeling like you need to throw up. These symptoms may be an emergency. Call 911 right away. Do not wait to see if the symptoms will go away. Do not drive yourself to the hospital. This information is not intended to replace advice given to you by your health care provider. Make sure you discuss any questions you have with your health care provider. Document Revised: 11/19/2022 Document Reviewed: 11/19/2022 Elsevier Patient Education  2024 [employer]  Open Hernia Repair, Adult, Care After After an open hernia repair, it's common to have: Pain in your belly. Slight bruising. A little swelling. A small amount of blood from the cut from surgery. Follow these instructions at home: Medicines Take your medicines only as told by your health care provider. If told, take steps to prevent trouble pooping. You may need to: Drink enough fluid to keep your pee (urine) pale yellow. Take medicines to help you poop. Eat foods  high in fiber. These include beans, whole grains, and fresh fruits and vegetables. Ask your provider if you should avoid driving or using machines while you're taking your medicine. If you were given antibiotics, take them as told by your provider. Do not stop taking them even if you start to feel better. Incision care  Take care of your cut from surgery as told by your provider. Make sure you: Wash your hands with soap and water for at least 20 seconds before and after you change your bandage. If you can't use soap and water, use hand sanitizer. Change your bandage. Leave stitches or skin glue in place for at least 2 weeks. Leave tape strips alone unless you're told to take them off. You may trim the edges of the tape strips if they curl up. Check the area around your cut every day for signs of infection. Check for: More redness, swelling, or pain. More fluid or blood. Warmth. Pus or a bad smell. Wear loose clothes while your cut heals. Activity Rest as told.  You may have to avoid lifting. Ask how much weight you can safely lift. Do not play contact sports until your provider says it's safe. Return to normal activities when you're told. Ask what things are safe for you to do. General instructions If you were given a sedative during your surgery, do not drive or use machines until you're told it's safe. A sedative can make you sleepy. Do not take baths, swim, or use a hot tub until told. Ask if showers are okay. You may need to take sponge baths only. Hold a pillow over your belly when you cough or sneeze. This helps with pain. Do not smoke, vape, or use products with nicotine or tobacco in them. If you need help quitting, talk with your provider. Your provider may give you more instructions. Make sure you know what you can and can't do. Contact a health care provider if: You have signs of infection. You have a fever or chills. You have blood in your poop. You haven't pooped in 2-3  days. Your pain doesn't get better with medicine. Get help right away if: You have chest pain. You're short of breath. You feel faint or light-headed. You have very bad pain. You start to vomit. You have pain, swelling, or redness in a leg. These symptoms may be an emergency. Call 911 right away. Do not wait to see if the symptoms will go away. Do not drive yourself to the hospital. This information is not intended to replace advice given to you by your health care provider. Make sure you discuss any questions you have with your health care provider. Document Revised: 12/24/2022 Document Reviewed: 06/16/2022 Elsevier Patient Education  2024 [employer].

## 2023-10-10 NOTE — Anesthesia Preprocedure Evaluation (Addendum)
 Anesthesia Evaluation  Patient identified by MRN, date of birth, ID band Patient awake    Reviewed: Allergy & Precautions, NPO status , Patient's Chart, lab work & pertinent test results  History of Anesthesia Complications Negative for: history of anesthetic complications  Airway Mallampati: III   Neck ROM: Full    Dental  (+) Edentulous Upper, Edentulous Lower   Pulmonary COPD, former smoker (quit 3 years ago)   Pulmonary exam normal breath sounds clear to auscultation       Cardiovascular negative cardio ROS Normal cardiovascular exam Rhythm:Regular Rate:Normal     Neuro/Psych    Depression    Chronic pain    GI/Hepatic negative GI ROS,,,  Endo/Other  negative endocrine ROS    Renal/GU negative Renal ROS     Musculoskeletal  (+) Arthritis ,  Fibromyalgia -SLE; Raynaud   Abdominal   Peds  Hematology negative hematology ROS (+)   Anesthesia Other Findings   Reproductive/Obstetrics                              Anesthesia Physical Anesthesia Plan  ASA: 3 and emergent  Anesthesia Plan: General   Post-op Pain Management:    Induction: Intravenous  PONV Risk Score and Plan: 2 and Ondansetron , Dexamethasone  and Treatment may vary due to age or medical condition  Airway Management Planned: Oral ETT  Additional Equipment:   Intra-op Plan:   Post-operative Plan: Extubation in OR  Informed Consent: I have reviewed the patients History and Physical, chart, labs and discussed the procedure including the risks, benefits and alternatives for the proposed anesthesia with the patient or authorized representative who has indicated his/her understanding and acceptance.     Dental advisory given  Plan Discussed with: CRNA  Anesthesia Plan Comments: (Patient consented for risks of anesthesia including but not limited to:  - adverse reactions to medications - damage to eyes, teeth,  lips or other oral mucosa - nerve damage due to positioning  - sore throat or hoarseness - damage to heart, brain, nerves, lungs, other parts of body or loss of life  Informed patient about role of CRNA in peri- and intra-operative care.  Patient voiced understanding.)         Anesthesia Quick Evaluation

## 2023-10-10 NOTE — Progress Notes (Signed)
 D/W the pt in detail about post op pain management. I won't be able to provide long term prescription narcotics. I typically do one time script for pain meds. He fully understands and is in agreement. We also talked about potential real risk of surgery including but not limited to bleeding, infection, chronic pain, nerve pain, recurrence, bowel or bladder injuries. HE is aware and wishes to proceed

## 2023-10-10 NOTE — Anesthesia Procedure Notes (Signed)
 Procedure Name: Intubation Date/Time: 10/10/2023 8:46 AM  Performed by: Delores Evalene BROCKS, CRNAPre-anesthesia Checklist: Patient identified, Patient being monitored, Timeout performed, Emergency Drugs available and Suction available Patient Re-evaluated:Patient Re-evaluated prior to induction Oxygen Delivery Method: Circle system utilized Preoxygenation: Pre-oxygenation with 100% oxygen Induction Type: IV induction Ventilation: Mask ventilation without difficulty Laryngoscope Size: Mac and 3 Grade View: Grade I Tube type: Oral Tube size: 7.0 mm Number of attempts: 1 Airway Equipment and Method: Stylet and Video-laryngoscopy Placement Confirmation: ETT inserted through vocal cords under direct vision, positive ETCO2 and breath sounds checked- equal and bilateral Secured at: 21 cm Tube secured with: Tape Dental Injury: Teeth and Oropharynx as per pre-operative assessment

## 2023-10-10 NOTE — Plan of Care (Signed)

## 2023-10-10 NOTE — Transfer of Care (Signed)
 Immediate Anesthesia Transfer of Care Note  Patient: Jason Cantu.  Procedure(s) Performed: REPAIR, HERNIA, INGUINAL, ADULT WITH MESH (Right: Abdomen)  Patient Location: PACU  Anesthesia Type:General  Level of Consciousness: awake, alert , and oriented  Airway & Oxygen Therapy: Patient Spontanous Breathing and Patient connected to face mask oxygen  Post-op Assessment: Report given to RN and Post -op Vital signs reviewed and stable  Post vital signs: Reviewed and stable  Last Vitals:  Vitals Value Taken Time  BP 143/88 10/10/23 10:15  Temp    Pulse 70 10/10/23 10:17  Resp 19 10/10/23 10:17  SpO2 100 % 10/10/23 10:17  Vitals shown include unfiled device data.  Last Pain:  Vitals:   10/10/23 0737  TempSrc: Oral  PainSc: 6          Complications: No notable events documented.

## 2023-10-10 NOTE — Anesthesia Postprocedure Evaluation (Signed)
 Anesthesia Post Note  Patient: Ahmaad Neidhardt.  Procedure(s) Performed: REPAIR, HERNIA, INGUINAL, ADULT WITH MESH (Right: Abdomen)  Patient location during evaluation: PACU Anesthesia Type: General Level of consciousness: awake and alert, oriented and patient cooperative Pain management: pain level controlled Vital Signs Assessment: post-procedure vital signs reviewed and stable Respiratory status: spontaneous breathing, nonlabored ventilation and respiratory function stable Cardiovascular status: blood pressure returned to baseline and stable Postop Assessment: adequate PO intake Anesthetic complications: no   No notable events documented.   Last Vitals:  Vitals:   10/10/23 1100 10/10/23 1117  BP: 131/77 125/74  Pulse: 73 77  Resp: 12 17  Temp:  (!) 36.4 C  SpO2: 99% 99%    Last Pain:  Vitals:   10/10/23 1117  TempSrc: Oral  PainSc:                  Krystyna Cleckley

## 2023-10-10 NOTE — Interval H&P Note (Signed)
 History and Physical Interval Note:  10/10/2023 8:10 AM  Jason Cantu.  has presented today for surgery, with the diagnosis of Inguinal hernia.  The various methods of treatment have been discussed with the patient and family. After consideration of risks, benefits and other options for treatment, the patient has consented to  Procedure(s): REPAIR, HERNIA, INGUINAL, ADULT (Right) as a surgical intervention.  The patient's history has been reviewed, patient examined, no change in status, stable for surgery.  I have reviewed the patient's chart and labs.  Questions were answered to the patient's satisfaction.     [name]

## 2023-10-10 NOTE — Progress Notes (Signed)
 At midnight pt stated that he is unable to sleep and insisted on getting Seroquel  which he stated was his home med. RN verified the home med dosing twice from the patient and on-call provider was notified.   100 mg Seroquel  was ordered for one time. RN made attempts to give medication to patient twice but he was asleep.  At 3 am patient woke up and insisted on receiving his Seroquel . RN educated patient it is too late to receive sleep medication given that he also has surgery. Patient insisted and pharmacy was called to reschedule medication.

## 2023-10-11 ENCOUNTER — Other Ambulatory Visit: Payer: Self-pay

## 2023-10-11 ENCOUNTER — Encounter: Payer: Self-pay | Admitting: Surgery

## 2023-10-11 DIAGNOSIS — K4090 Unilateral inguinal hernia, without obstruction or gangrene, not specified as recurrent: Secondary | ICD-10-CM | POA: Diagnosis not present

## 2023-10-11 LAB — CBC
HCT: 31.2 % — ABNORMAL LOW (ref 39.0–52.0)
Hemoglobin: 10.2 g/dL — ABNORMAL LOW (ref 13.0–17.0)
MCH: 31.1 pg (ref 26.0–34.0)
MCHC: 32.7 g/dL (ref 30.0–36.0)
MCV: 95.1 fL (ref 80.0–100.0)
Platelets: 252 K/uL (ref 150–400)
RBC: 3.28 MIL/uL — ABNORMAL LOW (ref 4.22–5.81)
RDW: 12.7 % (ref 11.5–15.5)
WBC: 10.1 K/uL (ref 4.0–10.5)
nRBC: 0 % (ref 0.0–0.2)

## 2023-10-11 LAB — BASIC METABOLIC PANEL WITH GFR
Anion gap: 6 (ref 5–15)
BUN: 9 mg/dL (ref 6–20)
CO2: 24 mmol/L (ref 22–32)
Calcium: 9 mg/dL (ref 8.9–10.3)
Chloride: 109 mmol/L (ref 98–111)
Creatinine, Ser: 0.65 mg/dL (ref 0.61–1.24)
GFR, Estimated: >60 mL/min (ref >60)
Glucose, Bld: 94 mg/dL (ref 70–99)
Potassium: 3.4 mmol/L — ABNORMAL LOW (ref 3.5–5.1)
Sodium: 139 mmol/L (ref 135–145)

## 2023-10-11 MED ORDER — OXYCODONE HCL 5 MG PO TABS: 5.0000 mg | ORAL_TABLET | Freq: Four times a day (QID) | ORAL | 0 refills | Status: DC | PRN

## 2023-10-11 MED ORDER — SERTRALINE HCL 100 MG PO TABS: 200.0000 mg | ORAL_TABLET | Freq: Every day | ORAL | Status: AC

## 2023-10-11 MED ORDER — ENSURE PLUS HIGH PROTEIN PO LIQD
237.0000 mL | Freq: Two times a day (BID) | ORAL | Status: DC
  Administered 2023-10-11: 237 mL via ORAL

## 2023-10-11 NOTE — Discharge Summary (Signed)
 Jason Cantu. FMW:[id number] DOB: [birth year] DOA: 10/09/2023  PCP: Reino Anes, MD  Admit date: 10/09/2023 Discharge date: 10/11/2023  Time spent: 35 minutes  Recommendations for Outpatient Follow-up:  Gen surg f/u 2 weeks Pcp f/u as well, will need plan for surveillance of incidental pulmonary nodule    Discharge Diagnoses:  Principal Problem:   Inguinal hernia, right Active Problems:   COPD (chronic obstructive pulmonary disease) (HCC)   SLE (systemic lupus erythematosus) (HCC)   Abdominal pain   Discharge Condition: stable  Diet recommendation: heart healthy  Filed Weights   10/09/23 0032  Weight: 63.5 kg    History of present illness:  From admission h and p: Jason Cantu. is a 58 y.o. male with medical history significant of multiple inguinal and abdominal hernia status post repair.  Other comorbidities include fibromyalgia, chronic pain syndrome, lupus, arthritis, Raynaud's phenomenon.  She presents to the emergency room with complaints of right inguinal pain and swelling.  Patient reports having carried a heavy load today and thinks he must have popped a hernia.  He was seen and evaluated in the ED, hernia was reduced but patient continues to complain of unbearable pain.  Multiple opiates were offered with fentanyl , morphine , Dilaudid  and Toradol  with minimal effect.  Patient is requesting for more pain medication.  His medical record suggest he follows up with [facility] pain clinic.  He has contractual agreement with same.    Hospital Course:   # Symptomatic right inguinal hernia Repaired when he was in his 59s, now symptomatic, operative repair performed 7/6 (medium to small recurrent hernia with chronic incarceration). Pain controlled, ambulating satisfactorily, tolerating diet - gen surg f/u 2 weeks   # Lactic acidosis Resolved   # SLE? Recently saw rheum, labs negative, they have documented they think patient may not have lupus - will hold plaquenil   during recovery from this surgery, advise f/u with rheumatology   # Pulmonary nodule - outpt surveillance    Procedures: Inguinal hernia repair, open, with mesh  Consultations: Gen surg  Discharge Exam: Vitals:   10/11/23 0507 10/11/23 0815  BP:  113/75  Pulse:  75  Resp: 16 16  Temp:  98.5 F (36.9 C)  SpO2:  99%    General: NAD Cardiovascular: RRR Respiratory: CTAB Skin: intact healing right groin incision  Discharge Instructions   Discharge Instructions     Call MD for:  difficulty breathing, headache or visual disturbances   Complete by: As directed    Call MD for:  extreme fatigue   Complete by: As directed    Call MD for:  hives   Complete by: As directed    Call MD for:  persistant dizziness or light-headedness   Complete by: As directed    Call MD for:  persistant nausea and vomiting   Complete by: As directed    Call MD for:  redness, tenderness, or signs of infection (pain, swelling, redness, odor or green/yellow discharge around incision site)   Complete by: As directed    Call MD for:  severe uncontrolled pain   Complete by: As directed    Call MD for:  temperature >100.4   Complete by: As directed    Diet - low sodium heart healthy   Complete by: As directed    Discharge instructions   Complete by: As directed    Pt may shower 10/12/23 no need for dressing changes   Discharge wound care:   Complete by: As directed  Per general surgery's recommendations   Increase activity slowly   Complete by: As directed    Increase activity slowly   Complete by: As directed    Lifting restrictions   Complete by: As directed    20 lbs x 6 wks      Allergies as of 10/11/2023       Reactions   Trazodone And Nefazodone         Medication List     PAUSE taking these medications    hydroxychloroquine  200 MG tablet Wait to take this until your doctor or other care provider tells you to start again. Commonly known as: PLAQUENIL  Take 200 mg by  mouth 2 (two) times daily.       STOP taking these medications    ibuprofen 400 MG tablet Commonly known as: ADVIL       TAKE these medications    acetaminophen  500 MG tablet Commonly known as: TYLENOL  Take 1,000 mg by mouth.   aspirin  EC 81 MG tablet Take 81 mg by mouth daily.   busPIRone  10 MG tablet Commonly known as: BUSPAR  Take by mouth.   gabapentin  300 MG capsule Commonly known as: NEURONTIN  Take by mouth.   ondansetron  4 MG disintegrating tablet Commonly known as: Zofran  ODT Take 1 tablet (4 mg total) by mouth every 8 (eight) hours as needed for nausea or vomiting.   oxyCODONE  5 MG immediate release tablet Commonly known as: Oxy IR/ROXICODONE  Take 1 tablet (5 mg total) by mouth every 6 (six) hours as needed for moderate pain (pain score 4-6).   pantoprazole  20 MG tablet Commonly known as: Protonix  Take 1 tablet (20 mg total) by mouth daily.   ProAir  HFA 108 (90 Base) MCG/ACT inhaler Generic drug: albuterol  Inhale 1-2 puffs into the lungs every 6 (six) hours as needed.   sertraline  100 MG tablet Commonly known as: ZOLOFT  Take 2 tablets (200 mg total) by mouth at bedtime. What changed: how much to take   traZODone 50 MG tablet Commonly known as: DESYREL Take 100 mg by mouth.               Discharge Care Instructions  (From admission, onward)           Start     Ordered   10/11/23 0000  Discharge wound care:       Comments: Per general surgery's recommendations   10/11/23 1103           Allergies  Allergen Reactions   Trazodone And Nefazodone     Follow-up Information     Pabon, [place] F, MD. Schedule an appointment as soon as possible for a visit in 2 week(s).   Specialty: General Surgery Why: s/p right inguinal hernia repair Contact information: [address] [place] [address] [phone]                  The results of significant diagnostics from this hospitalization (including  imaging, microbiology, ancillary and laboratory) are listed below for reference.    Significant Diagnostic Studies: DG Abd 1 View Result Date: 10/09/2023 CLINICAL DATA:  Abdominal pain EXAM: ABDOMEN - 1 VIEW COMPARISON:  CT from earlier today FINDINGS: Bilateral renal calculi are again noted. The largest is in the inferior pole collecting system of the left kidney measuring 9 mm. The bowel gas pattern is nonobstructed. No dilated large or small bowel loops. Moderate retained stool in the right colon. Lumbar spondylosis. Contrast material from earlier CT is identified  within the bladder IMPRESSION: 1. Nonobstructive bowel gas pattern. 2. Bilateral renal calculi. Electronically Signed   By: Waddell Calk M.D.   On: 10/09/2023 13:15   CT ABDOMEN PELVIS W CONTRAST Result Date: 10/09/2023 CLINICAL DATA:  Bowel obstruction suspected. Right sided inguinal hernia pain starting approx 1 hour PTA. States pain 10/10. EXAM: CT ABDOMEN AND PELVIS WITH CONTRAST TECHNIQUE: Multidetector CT imaging of the abdomen and pelvis was performed using the standard protocol following bolus administration of intravenous contrast. RADIATION DOSE REDUCTION: This exam was performed according to the departmental dose-optimization program which includes automated exposure control, adjustment of the mA and/or kV according to patient size and/or use of iterative reconstruction technique. CONTRAST:  OMNIPAQUE  IOHEXOL  300 MG/ML  SOLN COMPARISON:  CT abdomen pelvis 05/04/2016. CT abdomen pelvis 01/27/2016 FINDINGS: Lower chest: Partially visualized right middle lobe 6 mm pulmonary nodule. Hepatobiliary: No focal liver abnormality. Calcified gallstone noted within the gallbladder lumen. No gallbladder wall thickening or pericholecystic fluid. No biliary dilatation. Pancreas: No focal lesion. Normal pancreatic contour. No surrounding inflammatory changes. No main pancreatic ductal dilatation. Spleen: Normal in size without focal abnormality.  Adrenals/Urinary Tract: No adrenal nodule bilaterally. Bilateral kidneys enhance symmetrically. No hydronephrosis. No hydroureter. Bilateral nephrolithiasis measuring up to 1.2 cm on the left and 1 cm on the right. The urinary bladder is unremarkable. On delayed imaging, there is no urothelial wall thickening and there are no filling defects in the opacified portions of the bilateral collecting systems or ureters. Stomach/Bowel: Stomach is within normal limits. No evidence of bowel wall thickening or dilatation. Colonic diverticulosis. Appendix appears normal. Vascular/Lymphatic: No abdominal aorta or iliac aneurysm. Moderate atherosclerotic plaque of the aorta and its branches. No abdominal, pelvic, or inguinal lymphadenopathy. Reproductive: Prostate is unremarkable. Other: No intraperitoneal free fluid. No intraperitoneal free gas. No organized fluid collection. Musculoskeletal: Small fat containing right inguinal hernia (5:30, 6:73). No suspicious lytic or blastic osseous lesions. No acute displaced fracture. Multilevel degenerative changes of the spine. Mild digital is sizes of L1 on L2, L2 on L3, L3 on L4, L4 on L5, L5 on S1. IMPRESSION: 1. Small fat containing right inguinal hernia. Correlate clinically for incarceration. 2. Nonobstructive bilateral nephrolithiasis. 3. Cholelithiasis with no CT evidence of acute cholecystitis. 4. Colonic diverticulosis with no acute diverticulitis. 5. Right solid pulmonary nodule measuring 6 mm. Per Fleischner Society Guidelines, recommend a non-contrast Chest CT at 6-12 months. If patient is high risk for malignancy, consider an additional non-contrast Chest CT at 18-24 months. If patient is low risk for malignancy, non-contrast Chest CT at 18-24 months is optional. These guidelines do not apply to immunocompromised patients and patients with cancer. Follow up in patients with significant comorbidities as clinically warranted. For lung cancer screening, adhere to Lung-RADS  guidelines. Reference: Radiology. 2017; 284(1):228-43. Electronically Signed   By: Morgane  Naveau M.D.   On: 10/09/2023 02:11    Microbiology: No results found for this or any previous visit (from the past 240 hours).   Labs: Basic Metabolic Panel: Recent Labs  Lab 10/09/23 0046 10/09/23 0447 10/11/23 0335  NA 136 136 139  K 3.0* 3.4* 3.4*  CL 103 102 109  CO2 21* 23 24  GLUCOSE 114* 121* 94  BUN 9 7 9   CREATININE 0.83 0.76 0.65  CALCIUM 9.8 9.3 9.0   Liver Function Tests: Recent Labs  Lab 10/09/23 0046  AST 25  ALT 14  ALKPHOS 64  BILITOT 0.6  PROT 7.0  ALBUMIN 4.2   No results for  input(s): LIPASE, AMYLASE in the last 168 hours. No results for input(s): AMMONIA in the last 168 hours. CBC: Recent Labs  Lab 10/09/23 0046 10/09/23 0447 10/11/23 0335  WBC 16.6* 12.9* 10.1  NEUTROABS 13.2*  --   --   HGB 11.8* 12.4* 10.2*  HCT 34.9* 36.9* 31.2*  MCV 93.3 93.9 95.1  PLT 320 301 252   Cardiac Enzymes: No results for input(s): CKTOTAL, CKMB, CKMBINDEX, TROPONINI in the last 168 hours. BNP: BNP (last 3 results) No results for input(s): BNP in the last 8760 hours.  ProBNP (last 3 results) No results for input(s): PROBNP in the last 8760 hours.  CBG: No results for input(s): GLUCAP in the last 168 hours.     Signed:  Devaughn KATHEE Ban MD.  [facility] 10/11/2023, 11:05 AM

## 2023-10-11 NOTE — Progress Notes (Signed)
 [facility] SURGICAL PROGRESS NOTE  Hospital Day(s): 1.   Post op day(s): 1 Day Post-Op.   Interval History:  Patient seen and examined No acute events or new complaints overnight.  Patient reports He is doing okay; sore expectedly No fever, chills, nausea, emesis  Previous leukocytosis resolved; WBC 10.1K this AM Hgb to 10.2; likely dilutional Renal function normal; sCr - 0.65; UO - unmeasured Mild hypokalemia to 3.4 Regular diet   Vital signs in last 24 hours: [min-max] current  Temp:  [97.5 F (36.4 C)-98.5 F (36.9 C)] 98.1 F (36.7 C) (07/07 0457) Pulse Rate:  [69-81] 73 (07/07 0457) Resp:  [11-20] 16 (07/07 0507) BP: (100-143)/(65-88) 114/65 (07/07 0457) SpO2:  [94 %-100 %] 99 % (07/07 0457)     Height: 5' 6 (167.6 cm) Weight: 63.5 kg BMI (Calculated): 22.61   Intake/Output last 2 shifts:  07/06 0701 - 07/07 0700 In: 3001.1 [P.O.:820; I.V.:1882.7; IV Piggyback:298.4] Out: 5 [Blood:5]   Physical Exam:  Constitutional: alert, cooperative and no distress  Respiratory: breathing non-labored at rest  Cardiovascular: regular rate and sinus rhythm  Integumentary: Right inguinal incision is CDI with dermabond, ecchymotic, no erythema or drainage   Labs:     Latest Ref Rng & Units 10/11/2023    3:35 AM 10/09/2023    4:47 AM 10/09/2023   12:46 AM  CBC  WBC 4.0 - 10.5 K/uL 10.1  12.9  16.6   Hemoglobin 13.0 - 17.0 g/dL 89.7  87.5  88.1   Hematocrit 39.0 - 52.0 % 31.2  36.9  34.9   Platelets 150 - 400 K/uL 252  301  320       Latest Ref Rng & Units 10/11/2023    3:35 AM 10/09/2023    4:47 AM 10/09/2023   12:46 AM  CMP  Glucose 70 - 99 mg/dL 94  878  885   BUN 6 - 20 mg/dL 9  7  9    Creatinine 0.61 - 1.24 mg/dL 9.34  9.23  9.16   Sodium 135 - 145 mmol/L 139  136  136   Potassium 3.5 - 5.1 mmol/L 3.4  3.4  3.0   Chloride 98 - 111 mmol/L 109  102  103   CO2 22 - 32 mmol/L 24  23  21    Calcium 8.9 - 10.3 mg/dL 9.0  9.3  9.8   Total Protein 6.5 - 8.1 g/dL    7.0   Total Bilirubin 0.0 - 1.2 mg/dL   0.6   Alkaline Phos 38 - 126 U/L   64   AST 15 - 41 U/L   25   ALT 0 - 44 U/L   14     Imaging studies: No new pertinent imaging studies   Assessment/Plan: 58 y.o. male 1 Day Post-Op s/p open right inguinal hernia repair with mesh for recurrent right inguinal hernia   - Okay to continue diet as tolerated - Updated on post-operative instructions, lifting restrictions, wound care - Pain control prn   - Further management per primary service     - Discharge Planning: Okay for discharge from surgical perspective. Will update instructions and follow up. Please call (or message) with questions.   All of the above findings and recommendations were discussed with the patient, and the medical team, and all of patient's questions were answered to his expressed satisfaction.  -- Arthea Platt, PA-C [facility] 10/11/2023, 7:26 AM M-F: 7am - 4pm

## 2023-10-25 ENCOUNTER — Ambulatory Visit (INDEPENDENT_AMBULATORY_CARE_PROVIDER_SITE_OTHER): Admitting: Surgery

## 2023-10-25 ENCOUNTER — Encounter: Payer: Self-pay | Admitting: Surgery

## 2023-10-25 VITALS — BP 122/68 | HR 90 | Temp 97.9°F | Ht 66.0 in | Wt 145.4 lb

## 2023-10-25 DIAGNOSIS — K4031 Unilateral inguinal hernia, with obstruction, without gangrene, recurrent: Secondary | ICD-10-CM

## 2023-10-25 DIAGNOSIS — Z09 Encounter for follow-up examination after completed treatment for conditions other than malignant neoplasm: Secondary | ICD-10-CM

## 2023-10-25 NOTE — Patient Instructions (Signed)

## 2023-10-25 NOTE — Progress Notes (Signed)
 Outpatient Surgical Follow Up  10/25/2023  Jason Cantu. is an 58 y.o. male.   No chief complaint on file.   HPI: s/p [facility] 10/10/23. Doing well, minimal pain. No fevers or chills, ambulating   Past Medical History:  Diagnosis Date   Arthritis    COPD (chronic obstructive pulmonary disease) (HCC) 08/22/2015   DJD (degenerative joint disease) 08/22/2015   Fibromyalgia 08/22/2015   H/O degenerative disc disease    Lupus (systemic lupus erythematosus) (HCC)    Osteoarthritis 08/22/2015   Pain medication agreement signed 09/05/2015   Overview:  Patient signed pain contract with [facility] on September 05, 2015 and is only to receive opiate medications from [facility].   Raynaud's phenomenon 08/22/2015   SLE (systemic lupus erythematosus) (HCC) 08/22/2015    Past Surgical History:  Procedure Laterality Date   ABDOMINAL SURGERY     COLONOSCOPY WITH PROPOFOL  N/A 03/13/2016   Procedure: COLONOSCOPY WITH PROPOFOL ;  Surgeon: Ruel Kung, MD;  Location: [facility] ENDOSCOPY;  Service: Endoscopy;  Laterality: N/A;   COLONOSCOPY WITH PROPOFOL  N/A 07/31/2016   Procedure: COLONOSCOPY WITH PROPOFOL ;  Surgeon: Ruel Kung, MD;  Location: [facility] ENDOSCOPY;  Service: Endoscopy;  Laterality: N/A;   ESOPHAGOGASTRODUODENOSCOPY (EGD) WITH PROPOFOL  N/A 03/13/2016   Procedure: ESOPHAGOGASTRODUODENOSCOPY (EGD) WITH PROPOFOL ;  Surgeon: Ruel Kung, MD;  Location: [facility] ENDOSCOPY;  Service: Endoscopy;  Laterality: N/A;   HERNIA REPAIR     INGUINAL HERNIA REPAIR Right 10/10/2023   Procedure: REPAIR, HERNIA, INGUINAL, ADULT WITH MESH;  Surgeon: Jordis Laneta FALCON, MD;  Location: [facility] ORS;  Service: General;  Laterality: Right;    Family History  Problem Relation Age of Onset   Heart disease Mother    Hyperlipidemia Mother    Hypertension Mother    Heart disease Father    Hyperlipidemia Father    Hypertension Father    Heart disease Brother    Hyperlipidemia Brother    Diabetes Brother     Social History:   reports that he has been smoking cigarettes. He has never used smokeless tobacco. He reports current alcohol use. He reports that he does not use drugs.  Allergies:  Allergies  Allergen Reactions   Trazodone And Nefazodone     Medications reviewed.    ROS Full ROS performed and is otherwise negative other than what is stated in HPI   There were no vitals taken for this visit.  Physical Exam NAd alert Abd: soft, Nt, inguinal incision healing well, no infection or recurrence  Assessment/Plan: Doing very well after open IH repair. No evidence of complication No heavy lifting RTC prn   Laneta Jordis, MD [facility] General Surgeon

## 2024-02-29 ENCOUNTER — Other Ambulatory Visit: Payer: Self-pay | Admitting: Internal Medicine

## 2024-02-29 DIAGNOSIS — Z7689 Persons encountering health services in other specified circumstances: Secondary | ICD-10-CM

## 2024-02-29 DIAGNOSIS — I2510 Atherosclerotic heart disease of native coronary artery without angina pectoris: Secondary | ICD-10-CM

## 2024-04-05 ENCOUNTER — Encounter
Admission: RE | Disposition: A | Discharge status: Home / Self Care | Payer: Self-pay | Source: Home / Self Care | Attending: Internal Medicine

## 2024-04-05 ENCOUNTER — Ambulatory Visit
Admission: RE | Admit: 2024-04-05 | Discharge: 2024-04-05 | Disposition: A | Discharge status: Home / Self Care | Attending: Internal Medicine | Admitting: Internal Medicine

## 2024-04-05 ENCOUNTER — Encounter: Payer: Self-pay | Admitting: Internal Medicine

## 2024-04-05 DIAGNOSIS — Z79899 Other long term (current) drug therapy: Secondary | ICD-10-CM | POA: Diagnosis not present

## 2024-04-05 DIAGNOSIS — R9439 Abnormal result of other cardiovascular function study: Secondary | ICD-10-CM | POA: Diagnosis present

## 2024-04-05 DIAGNOSIS — Z7982 Long term (current) use of aspirin: Secondary | ICD-10-CM | POA: Diagnosis not present

## 2024-04-05 DIAGNOSIS — R0600 Dyspnea, unspecified: Secondary | ICD-10-CM | POA: Insufficient documentation

## 2024-04-05 HISTORY — PX: LEFT HEART CATH AND CORONARY ANGIOGRAPHY: CATH118249

## 2024-04-05 SURGERY — LEFT HEART CATH AND CORONARY ANGIOGRAPHY
Anesthesia: Moderate Sedation | Laterality: Left

## 2024-04-05 MED ORDER — ASPIRIN 81 MG PO CHEW
CHEWABLE_TABLET | ORAL | Status: AC
  Filled 2024-04-05: qty 1

## 2024-04-05 MED ORDER — HEPARIN (PORCINE) IN NACL 1000-0.9 UT/500ML-% IV SOLN
INTRAVENOUS | Status: AC
  Filled 2024-04-05: qty 1000

## 2024-04-05 MED ORDER — MIDAZOLAM HCL 2 MG/2ML IJ SOLN
INTRAMUSCULAR | Status: AC
  Filled 2024-04-05: qty 2

## 2024-04-05 MED ORDER — MIDAZOLAM HCL (PF) 2 MG/2ML IJ SOLN
INTRAMUSCULAR | Status: DC | PRN
  Administered 2024-04-05 (×2): 1 mg via INTRAVENOUS

## 2024-04-05 MED ORDER — SODIUM CHLORIDE 0.9% FLUSH: 3.0000 mL | INTRAVENOUS | Status: DC | PRN

## 2024-04-05 MED ORDER — FENTANYL CITRATE (PF) 100 MCG/2ML IJ SOLN
INTRAMUSCULAR | Status: AC
  Filled 2024-04-05: qty 2

## 2024-04-05 MED ORDER — FENTANYL CITRATE (PF) 100 MCG/2ML IJ SOLN
INTRAMUSCULAR | Status: DC | PRN
  Administered 2024-04-05 (×2): 50 ug via INTRAVENOUS

## 2024-04-05 MED ORDER — HEPARIN SODIUM (PORCINE) 1000 UNIT/ML IJ SOLN
INTRAMUSCULAR | Status: AC
  Filled 2024-04-05: qty 10

## 2024-04-05 MED ORDER — LIDOCAINE HCL (PF) 1 % IJ SOLN
INTRAMUSCULAR | Status: DC | PRN
  Administered 2024-04-05: 2 mL

## 2024-04-05 MED ORDER — IOHEXOL 300 MG/ML  SOLN
INTRAMUSCULAR | Status: DC | PRN
  Administered 2024-04-05: 49 mL

## 2024-04-05 MED ORDER — SODIUM CHLORIDE 0.9% FLUSH: 3.0000 mL | Freq: Two times a day (BID) | INTRAVENOUS | Status: DC

## 2024-04-05 MED ORDER — VERAPAMIL HCL 2.5 MG/ML IV SOLN
INTRAVENOUS | Status: DC | PRN
  Administered 2024-04-05: 2.5 mg via INTRA_ARTERIAL

## 2024-04-05 MED ORDER — SODIUM CHLORIDE 0.9 % IV SOLN
250.0000 mL | INTRAVENOUS | Status: DC | PRN
  Administered 2024-04-05: 250 mL via INTRAVENOUS

## 2024-04-05 MED ORDER — HEPARIN (PORCINE) IN NACL 1000-0.9 UT/500ML-% IV SOLN
INTRAVENOUS | Status: DC | PRN
  Administered 2024-04-05: 1000 mL

## 2024-04-05 MED ORDER — FREE WATER
500.0000 mL | Freq: Once | Status: AC
  Administered 2024-04-05: 500 mL via ORAL

## 2024-04-05 MED ORDER — VERAPAMIL HCL 2.5 MG/ML IV SOLN
INTRAVENOUS | Status: AC
  Filled 2024-04-05: qty 2

## 2024-04-05 MED ORDER — ASPIRIN 81 MG PO CHEW
81.0000 mg | CHEWABLE_TABLET | ORAL | Status: AC
  Administered 2024-04-05: 81 mg via ORAL

## 2024-04-05 MED ORDER — LIDOCAINE HCL 1 % IJ SOLN
INTRAMUSCULAR | Status: AC
  Filled 2024-04-05: qty 20

## 2024-04-05 MED ORDER — HEPARIN SODIUM (PORCINE) 1000 UNIT/ML IJ SOLN
INTRAMUSCULAR | Status: DC | PRN
  Administered 2024-04-05: 3000 [IU] via INTRAVENOUS

## 2024-04-05 MED ORDER — SODIUM CHLORIDE 0.9 % IV SOLN: 250.0000 mL | INTRAVENOUS | Status: DC | PRN

## 2024-04-05 MED ORDER — OXYCODONE HCL 5 MG PO TABS: 5.0000 mg | ORAL_TABLET | ORAL | Status: DC | PRN

## 2024-04-05 SURGICAL SUPPLY — 11 items
CATH INFINITI 5 FR JL3.5 (CATHETERS) IMPLANT
CATH INFINITI JR4 5F (CATHETERS) IMPLANT
DRAPE BRACHIAL (DRAPES) IMPLANT
GLIDESHEATH SLEND SS 6F .021 (SHEATH) IMPLANT
GUIDEWIRE INQWIRE 1.5J.035X260 (WIRE) IMPLANT
KIT ENCORE 26 ADVANTAGE (KITS) IMPLANT
KIT SYRINGE INJ CVI SPIKEX1 (MISCELLANEOUS) IMPLANT
PACK CARDIAC CATH (CUSTOM PROCEDURE TRAY) ×1 IMPLANT
SET ATX-X65L (MISCELLANEOUS) IMPLANT
STATION PROTECTION PRESSURIZED (MISCELLANEOUS) IMPLANT
TUBING CIL FLEX 10 FLL-RA (TUBING) IMPLANT

## 2024-04-12 LAB — CARDIAC CATHETERIZATION: Cath EF Quantitative: 40 %
